# Patient Record
Sex: Male | Born: 1990 | Race: White | Hispanic: No | Marital: Married | Smoking: Never smoker
Health system: Southern US, Community
[De-identification: ages and names within clinical notes are randomized; demographics above are authoritative.]

## PROBLEM LIST (undated history)

## (undated) DIAGNOSIS — I10 Essential (primary) hypertension: Secondary | ICD-10-CM

## (undated) DIAGNOSIS — L309 Dermatitis, unspecified: Secondary | ICD-10-CM

## (undated) HISTORY — DX: Essential (primary) hypertension: I10

## (undated) HISTORY — DX: Dermatitis, unspecified: L30.9

## (undated) HISTORY — PX: OTHER SURGICAL HISTORY: SHX169

## (undated) HISTORY — PX: VASECTOMY: SHX75

---

## 2014-05-27 DIAGNOSIS — D2361 Other benign neoplasm of skin of right upper limb, including shoulder: Secondary | ICD-10-CM | POA: Insufficient documentation

## 2016-08-07 ENCOUNTER — Emergency Department (HOSPITAL_COMMUNITY): Payer: 59

## 2016-08-07 ENCOUNTER — Encounter (HOSPITAL_COMMUNITY): Payer: Self-pay | Admitting: Emergency Medicine

## 2016-08-07 ENCOUNTER — Emergency Department (HOSPITAL_COMMUNITY)
Admission: EM | Admit: 2016-08-07 | Discharge: 2016-08-07 | Disposition: A | Discharge status: Home / Self Care | Payer: 59 | Attending: Emergency Medicine | Admitting: Emergency Medicine

## 2016-08-07 DIAGNOSIS — R202 Paresthesia of skin: Secondary | ICD-10-CM | POA: Diagnosis not present

## 2016-08-07 DIAGNOSIS — R4701 Aphasia: Secondary | ICD-10-CM | POA: Insufficient documentation

## 2016-08-07 DIAGNOSIS — G43809 Other migraine, not intractable, without status migrainosus: Secondary | ICD-10-CM | POA: Insufficient documentation

## 2016-08-07 DIAGNOSIS — G43909 Migraine, unspecified, not intractable, without status migrainosus: Secondary | ICD-10-CM | POA: Diagnosis not present

## 2016-08-07 DIAGNOSIS — R51 Headache: Secondary | ICD-10-CM | POA: Diagnosis not present

## 2016-08-07 DIAGNOSIS — R519 Headache, unspecified: Secondary | ICD-10-CM

## 2016-08-07 DIAGNOSIS — R0602 Shortness of breath: Secondary | ICD-10-CM | POA: Diagnosis not present

## 2016-08-07 DIAGNOSIS — R29818 Other symptoms and signs involving the nervous system: Secondary | ICD-10-CM | POA: Diagnosis not present

## 2016-08-07 LAB — COMPREHENSIVE METABOLIC PANEL
ALT: 25 U/L (ref 17–63)
ANION GAP: 11 (ref 5–15)
AST: 22 U/L (ref 15–41)
Albumin: 4.8 g/dL (ref 3.5–5.0)
Alkaline Phosphatase: 68 U/L (ref 38–126)
BILIRUBIN TOTAL: 0.6 mg/dL (ref 0.3–1.2)
BUN: 18 mg/dL (ref 6–20)
CO2: 24 mmol/L (ref 22–32)
Calcium: 10.1 mg/dL (ref 8.9–10.3)
Chloride: 102 mmol/L (ref 101–111)
Creatinine, Ser: 0.85 mg/dL (ref 0.61–1.24)
GFR calc Af Amer: >60 mL/min (ref >60)
Glucose, Bld: 110 mg/dL — ABNORMAL HIGH (ref 65–99)
POTASSIUM: 3.5 mmol/L (ref 3.5–5.1)
Sodium: 137 mmol/L (ref 135–145)
TOTAL PROTEIN: 7.8 g/dL (ref 6.5–8.1)

## 2016-08-07 LAB — CBC
HEMATOCRIT: 40.5 % (ref 39.0–52.0)
Hemoglobin: 14.9 g/dL (ref 13.0–17.0)
MCH: 31.4 pg (ref 26.0–34.0)
MCHC: 36.8 g/dL — AB (ref 30.0–36.0)
MCV: 85.4 fL (ref 78.0–100.0)
Platelets: 223 10*3/uL (ref 150–400)
RBC: 4.74 MIL/uL (ref 4.22–5.81)
RDW: 12.9 % (ref 11.5–15.5)
WBC: 4.9 10*3/uL (ref 4.0–10.5)

## 2016-08-07 LAB — DIFFERENTIAL
Basophils Absolute: 0 10*3/uL (ref 0.0–0.1)
Basophils Relative: 0 %
EOS ABS: 0.1 10*3/uL (ref 0.0–0.7)
EOS PCT: 1 %
LYMPHS ABS: 1.6 10*3/uL (ref 0.7–4.0)
Lymphocytes Relative: 32 %
MONO ABS: 0.4 10*3/uL (ref 0.1–1.0)
MONOS PCT: 8 %
Neutro Abs: 2.9 10*3/uL (ref 1.7–7.7)
Neutrophils Relative %: 59 %

## 2016-08-07 LAB — I-STAT TROPONIN, ED: TROPONIN I, POC: 0 ng/mL (ref 0.00–0.08)

## 2016-08-07 LAB — RAPID URINE DRUG SCREEN, HOSP PERFORMED
Amphetamines: NOT DETECTED
BENZODIAZEPINES: NOT DETECTED
Barbiturates: NOT DETECTED
Cocaine: NOT DETECTED
Opiates: NOT DETECTED
Tetrahydrocannabinol: NOT DETECTED

## 2016-08-07 LAB — I-STAT CHEM 8, ED
BUN: 19 mg/dL (ref 6–20)
CALCIUM ION: 1.16 mmol/L (ref 1.15–1.40)
CREATININE: 0.8 mg/dL (ref 0.61–1.24)
Chloride: 100 mmol/L — ABNORMAL LOW (ref 101–111)
Glucose, Bld: 109 mg/dL — ABNORMAL HIGH (ref 65–99)
HEMATOCRIT: 42 % (ref 39.0–52.0)
HEMOGLOBIN: 14.3 g/dL (ref 13.0–17.0)
Potassium: 3.5 mmol/L (ref 3.5–5.1)
SODIUM: 138 mmol/L (ref 135–145)
TCO2: 25 mmol/L (ref 0–100)

## 2016-08-07 LAB — PROTIME-INR
INR: 1.05
Prothrombin Time: 13.8 seconds (ref 11.4–15.2)

## 2016-08-07 LAB — APTT: aPTT: 26 seconds (ref 24–36)

## 2016-08-07 MED ORDER — DIPHENHYDRAMINE HCL 50 MG/ML IJ SOLN
25.0000 mg | Freq: Once | INTRAMUSCULAR | Status: AC
  Administered 2016-08-07: 25 mg via INTRAVENOUS
  Filled 2016-08-07: qty 1

## 2016-08-07 MED ORDER — SODIUM CHLORIDE 0.9 % IV BOLUS (SEPSIS)
1000.0000 mL | Freq: Once | INTRAVENOUS | Status: AC
  Administered 2016-08-07: 1000 mL via INTRAVENOUS

## 2016-08-07 MED ORDER — PROCHLORPERAZINE EDISYLATE 5 MG/ML IJ SOLN
10.0000 mg | Freq: Once | INTRAMUSCULAR | Status: AC
  Administered 2016-08-07: 10 mg via INTRAVENOUS
  Filled 2016-08-07: qty 2

## 2016-08-07 NOTE — Consult Note (Signed)
Neurology Consultation  Reason for Consult: headache and stroke like symptoms  Referring Physician: ER - Dr. Rubin Payor / Dr. Dalene Seltzer  CC: headache/speech problems/numbness/weakness  History is obtained from: patient  HPI: Joshua Wise is a 26 y.o. male with no PMH who woke up this morning around 7 AM with what he describes as a "pounding" behind his right eye. He says he does not have a history of migraines in the recent past and had a few migraine headaches in freshman year of  college but they were infrequent. This morning, his headaches arisen as he woke up, he had photophobia with it and had some nausea vomiting as well. He was taken to [facility] where a noncontrast CT head was done which was unremarkable. He had also that on complaint of right facial numbness and according to the ER report also had some expressive aphasia all of which had resolved by the time he reached the outside hospital emergency room. He was transferred to our hospital for a neurological evaluation as well as a stat MRI. Upon arrival here, his vitals showed a blood pressure 148/92, normal heart rate and respiratory rate was saturating normally on RA. He had no focal neurological complaints but said that he is head was hurting very badly and he had some numbness around his mouth. He denied any preceding illnesses. He denied any chest pain. He denies any shortness of breath. He denied any nausea or vomiting currently. He denied using any illicit drugs. He drinks alcohol occasionally and had 6-8 beers yesterday. He has no family history of strokes or MI.   LKW: Woke up at 7 AM with symptoms. Last known normal last night tpa given?: no - less likely a stroke, NIHSS 0. More likely a complex migraine. Premorbid modified rankin scale: 0 ICH Score: Not applicable   ROS: A 14 point ROS was performed and is negative except as noted in the HPI.  History reviewed. No pertinent past medical history.  Family  history Both parents alive and healthy with no medical conditions. No family history of strokes or MIs.   Social History: Denies smoking or drug use Denies illicit drug use Occasional alcohol use with last use yesterday 6-8 beers.  Exam: Current vital signs: BP (!) 145/97   Pulse (!) 57   Temp 97.7 F (36.5 C) (Oral)   Resp 14   Ht 6' (1.829 m)   Wt 83.9 kg (185 lb)   SpO2 97%   BMI 25.09 kg/m  Vital signs in last 24 hours: Temp:  [97.7 F (36.5 C)] 97.7 F (36.5 C) (07/09 1016) Pulse Rate:  [57-72] 57 (07/09 1100) Resp:  [14-26] 14 (07/09 1018) BP: (139-147)/(89-97) 145/97 (07/09 1100) SpO2:  [97 %-100 %] 97 % (07/09 1100) Weight:  [83.9 kg (185 lb)] 83.9 kg (185 lb) (07/09 0920)   Physical Exam  Constitutional: Appears well-developed and well-nourished.  Psych: Affect appropriate to situation Eyes: No scleral injection HENT: [place] AT clear nares, clear throat Head: Normocephalic.  Cardiovascular: Normal rate and regular rhythm. No carotid bruit Respiratory: Effort normal and breath sounds normal to anterior ascultation GI: Soft.  No distension. There is no tenderness.  Skin: WDI  Neuro: Mental Status: Patient is awake, alert, oriented to person, place, month, year, and situation. Patient is able to give a clear and coherent history. No signs of aphasia or neglect Cranial Nerves: II: Visual Fields are full. Pupils are equal, round, and reactive to light. III,IV, VI: EOMI without ptosis or  diploplia.  V: Facial sensation is symmetric to temperature VII: Facial movement is symmetric.  VIII: hearing is intact to voice X: Uvula elevates symmetrically XI: Shoulder shrug is symmetric. XII: tongue is midline without atrophy or fasciculations.  Motor: Tone is normal. Bulk is normal. 5/5 strength was present in all four extremities. Sensory: Sensation is symmetric to light touch and temperature in the arms and legs. Deep Tendon Reflexes: 2+ and symmetric in the  biceps and patellae. Plantars: Toes are downgoing bilaterally. Cerebellar: FNF and HKS are intact bilaterally  NIHSS - 0  CBC    Component Value Date/Time   WBC 4.9 08/07/2016 0942   RBC 4.74 08/07/2016 0942   HGB 14.3 08/07/2016 0955   HCT 42.0 08/07/2016 0955   PLT 223 08/07/2016 0942   MCV 85.4 08/07/2016 0942   MCH 31.4 08/07/2016 0942   MCHC 36.8 (H) 08/07/2016 0942   RDW 12.9 08/07/2016 0942   LYMPHSABS 1.6 08/07/2016 0942   MONOABS 0.4 08/07/2016 0942   EOSABS 0.1 08/07/2016 0942   BASOSABS 0.0 08/07/2016 0942   CMP     Component Value Date/Time   NA 138 08/07/2016 0955   K 3.5 08/07/2016 0955   CL 100 (L) 08/07/2016 0955   CO2 24 08/07/2016 0942   GLUCOSE 109 (H) 08/07/2016 0955   BUN 19 08/07/2016 0955   CREATININE 0.80 08/07/2016 0955   CALCIUM 10.1 08/07/2016 0942   PROT 7.8 08/07/2016 0942   ALBUMIN 4.8 08/07/2016 0942   AST 22 08/07/2016 0942   ALT 25 08/07/2016 0942   ALKPHOS 68 08/07/2016 0942   BILITOT 0.6 08/07/2016 0942   GFRNONAA >60 08/07/2016 0942   GFRAA >60 08/07/2016 0942    I have reviewed labs in epic and the results pertinent to this consultation are: Noncontrast [facility] shows no acute abnormality. [facility] shows no evidence of stroke or concern for demyelinating lesions.  Assessment: 26 year old Caucasian man with no past medical history presented for evaluation of acute disabling headache with right-sided facial numbness/tingling as well as reported word finding difficulty and right-sided weakness which had completely resolved by the time of this evaluation. On examination in the emergency room, his NIH stroke scale was 0. His neurological exam was essentially unremarkable. We recommended a stat MRI of the brain to rule out an acute stroke, which was done and was negative for acute stroke.   Impression -Migraine v. Atypical headache -Less likely a stroke-given atypical history,  presentation, negative imaging findings.  Recommendations: -Symptomatic treatment of headache - reglan/benadryl/toradol iv and IV hydration -Check alcohol levels and UDS and counsel on alcohol cessation/cessation of binge drinking. -Can be discharged home if symptoms improve with the regimen above. -Can follow up with outpatient neurology if needed. Please call with questions.  -- Milon DikesAshish Marna Weniger, MD [facility] [phone]  If 7pm to 7am, please call on call as listed on AMION..Marland Kitchen

## 2016-08-07 NOTE — ED Notes (Signed)
ED Provider at bedside. 

## 2016-08-07 NOTE — ED Triage Notes (Addendum)
patient called male visitor about 7425 ins ago stating that had headache and right sided numbness.  Patient also c/o SOB. Patient has no medical history. patient having blurred vision, patient had that intermittently yesterday but thought was related to lighting changes. Patient also having trouble getting thoughts out.

## 2016-08-07 NOTE — ED Provider Notes (Signed)
[facility]. Please see Dr. Arlington CalixPickering's note for history, physical and prior care. Briefly this is a 26yo male who presented with headache, aphasia, right sided numbness/weakness and facial droop.  Arrived and was evaluated by Neurology. Recommend MR brain, if negative, treatment for migraine and discharge with outpatient follow up.  MR shows no sign of CVA or other abnormalities.  Given compazine/benadryl with improvement in headache.  Suspect complicated migraine. Patient discharged in stable condition with understanding of reasons to return.    Alvira MondaySchlossman, Elizabth Palka, MD 08/08/16 1242

## 2016-08-07 NOTE — Code Documentation (Signed)
26yo male arriving to [facility] via [facility] at [id number].  [facility].  Patient [facility] following acute onset headache, right sided numbness and blurred vision at 0700 while at work.  Patient noted to have episodes of expressive [facility] and [facility].  Stroke team at the bedside on patient arrival.  NIHSS 0, see documentation for details and code stroke times.  Patient with no focal deficits on exam at this time.  Patient continues to c/o headache which patient describes as a migraine.  Of note, patient reports he drank 6-7 beers last night.  Patient to go to MRI when next scanner available.  Handoff with ED RN Chestine Sporelark.

## 2016-08-07 NOTE — ED Provider Notes (Signed)
[facility]-EMERGENCY DEPT Provider Note   CSN: [id number] Arrival date & time: 08/07/16  [id number]     History   Chief Complaint Chief Complaint  Patient presents with  . right sided numbness  . Headache  . Shortness of Breath   Level V caveat due to aphasia HPI Joshua Wise is a 26 y.o. male.  HPI Patient presents with headache difficulty speaking and some right-sided numbness. Woke up this morning normal. Around 7:00 developed a left-sided headache. Also had slight difficulty speaking at that time. Also some right-sided numbness/weakness. States he was having difficulty driving due to some shaking in his foot. States that his vision got a little blurry. States is blurry in both eyes. At around 9:00 became worse. Then sent to the ER. Headache is throbbing on the left side. Has occasional headaches but not this severe. Headache began somewhat gradually. No previous episodes of numbness or weakness. Patient's wife states that he had had a facial droop also. Facial droop was on the right side. That has since resolved. While I was interviewing him that was an episode where the aphasia resolved completely. Resolved for around 2 minutes and then returned.    History reviewed. No pertinent past medical history.  There are no active problems to display for this patient.   History reviewed. No pertinent surgical history.     Home Medications    Prior to Admission medications   Not on File    Family History No family history on file.  Social History Social History  Substance Use Topics  . Smoking status: Never Smoker  . Smokeless tobacco: Never Used  . Alcohol use No     Comment: occasional      Allergies   Patient has no known allergies.   Review of Systems Review of Systems  Unable to perform ROS: Patient nonverbal  Constitutional: Negative for appetite change.  Eyes: Positive for visual disturbance.  Respiratory: Positive for shortness of breath.   Gastrointestinal:  Negative for abdominal pain.  Musculoskeletal: Negative for back pain.  Neurological: Positive for speech difficulty, numbness and headaches. Negative for weakness.     Physical Exam Updated Vital Signs BP (!) 139/93   Pulse 65   Temp 97.7 F (36.5 C) (Oral)   Resp 14   Ht 6' (1.829 m)   Wt 83.9 kg (185 lb)   SpO2 100%   BMI 25.09 kg/m   Physical Exam  Constitutional: He is oriented to person, place, and time. He appears well-developed.  HENT:  Head: Atraumatic.  Eyes: EOM are normal.  Neck: Neck supple.  Cardiovascular: Normal rate.   Pulmonary/Chest: Effort normal.  Abdominal: Soft.  Musculoskeletal: He exhibits no edema or tenderness.  Neurological: He is alert and oriented to person, place, and time.  I movements intact. Pupils reactive. Visual fields grossly intact by confrontation. Face symmetric. Good smile bilaterally. Good grips bilaterally. Good flexion-extension of bilateral upper extremity's. Sensation intact grossly over hands. Sensation intact both lower extremities. Good straight leg raise bilaterally. Good flexion extension at ankles. Sensation intact over right and left lower extremity. Reportedly had normal ambulation in here. He does however have a expressive aphasia. Testis surgery for some words and is somewhat cannot find. Able to complete some sentences but not others. Does better with shorter sentences.  Skin: Skin is warm. Capillary refill takes less than 2 seconds.     ED Treatments / Results  Labs (all labs ordered are listed, but only abnormal results are displayed) Labs Reviewed  CBC - Abnormal; Notable for the following:       Result Value   MCHC 36.8 (*)    All other components within normal limits  I-STAT CHEM 8, ED - Abnormal; Notable for the following:    Chloride 100 (*)    Glucose, Bld 109 (*)    All other components within normal limits  PROTIME-INR  APTT  DIFFERENTIAL  COMPREHENSIVE METABOLIC PANEL  I-STAT TROPOININ, ED  CBG  MONITORING, ED    EKG  EKG Interpretation  Date/Time:  Monday August 07 2016 09:22:10 EDT Ventricular Rate:  76 PR Interval:    QRS Duration: 96 QT Interval:  383 QTC Calculation: 431 R Axis:   82 Text Interpretation:  Sinus rhythm Probable left ventricular hypertrophy Anterior ST elevation, probably due to LVH No old tracing to compare Confirmed by Rubin Payor  MD, Doyl Bitting [phone]) on 08/07/2016 9:26:35 AM       Radiology Ct Head Code Stroke W/o Cm  Result Date: 08/07/2016 CLINICAL DATA:  Code stroke. Sudden onset headache. Intermittent tingling and facial droop. Intermittent blurred vision. Right-sided numbness and weakness. EXAM: CT HEAD WITHOUT CONTRAST TECHNIQUE: Contiguous axial images were obtained from the base of the skull through the vertex without intravenous contrast. COMPARISON:  None FINDINGS: Brain: No acute infarct, hemorrhage, or mass lesion is present. The ventricles are of normal size. No significant extraaxial fluid collection is present. No significant white matter disease is present. The basal ganglia are intact. Vascular: The intracranial vessels are uniformly hyperdense, likely reflecting a relatively high hematocrit. There is no significant asymmetry. Skull: Normal. Negative for fracture or focal lesion. Sinuses/Orbits: The paranasal sinuses and mastoid air cells are clear. The globes and orbits are within normal limits. ASPECTS [facility] Stroke Program Early CT Score) - Ganglionic level infarction (caudate, lentiform nuclei, internal capsule, insula, M1-M3 cortex): 7/7 - Supraganglionic infarction (M4-M6 cortex): 3/3 Total score (0-10 with 10 being normal): 10/10 IMPRESSION: 1. Negative CT of the head. No acute or focal lesion to explain the patient's acute symptoms. 2. ASPECTS is 10/10 These results were called by telephone at the time of interpretation on 08/07/2016 at 9:55 Am to Dr. Benjiman Core , who verbally acknowledged these results. Electronically Signed   By:  Marin Roberts M.D.   On: 08/07/2016 10:05    Procedures Procedures (including critical care time)  Medications Ordered in ED Medications - No data to display   Initial Impression / Assessment and Plan / ED Course  I have reviewed the triage vital signs and the nursing notes.  Pertinent labs & imaging results that were available during my care of the patient were reviewed by me and considered in my medical decision making (see chart for details).     Patient with headache and neurologic deficits. Did deficit been waxing and waning including a period of resolution on the ER. However deficits did return. Initial head CT reassuring. Discussed with Dr. Alfredo Batty from neuroradiology. It appears I cannot get an MRI done here within the next 20 minutes. Code stroke was called. Potentially a complicated migraine but does have deficits. He did have some resolution of symptoms while in the ER. Will transfer to [facility] as a code stroke. Discussed with Dr Dalene Seltzer and Dr Wilford Corner.  CRITICAL CARE Performed by: Billee Cashing Total critical care time: 30 minutes Critical care time was exclusive of separately billable procedures and treating other patients. Critical care was necessary to treat or prevent imminent or life-threatening deterioration. Critical care was time spent  personally by me on the following activities: development of treatment plan with patient and/or surrogate as well as nursing, discussions with consultants, evaluation of patient's response to treatment, examination of patient, obtaining history from patient or surrogate, ordering and performing treatments and interventions, ordering and review of laboratory studies, ordering and review of radiographic studies, pulse oximetry and re-evaluation of patient's condition.    Final Clinical Impressions(s) / ED Diagnoses   Final diagnoses:  Expressive aphasia  Nonintractable headache, unspecified chronicity pattern,  unspecified headache type    New Prescriptions New Prescriptions   No medications on file     Benjiman Core, MD 08/07/16 1025

## 2016-08-07 NOTE — ED Notes (Signed)
Patient transported to MRI 

## 2016-08-07 NOTE — ED Notes (Signed)
Pt back from MRI 

## 2016-08-08 ENCOUNTER — Encounter: Payer: Self-pay | Admitting: Neurology

## 2016-11-13 ENCOUNTER — Ambulatory Visit: Payer: 59 | Admitting: Neurology

## 2016-11-30 ENCOUNTER — Encounter: Payer: Self-pay | Admitting: Family Medicine

## 2016-11-30 ENCOUNTER — Ambulatory Visit (INDEPENDENT_AMBULATORY_CARE_PROVIDER_SITE_OTHER): Payer: 59 | Admitting: Family Medicine

## 2016-11-30 VITALS — BP 132/80 | HR 82 | Temp 98.7°F | Resp 18 | Ht 73.0 in | Wt 190.6 lb

## 2016-11-30 DIAGNOSIS — R519 Headache, unspecified: Secondary | ICD-10-CM

## 2016-11-30 DIAGNOSIS — F419 Anxiety disorder, unspecified: Secondary | ICD-10-CM | POA: Diagnosis not present

## 2016-11-30 DIAGNOSIS — I10 Essential (primary) hypertension: Secondary | ICD-10-CM

## 2016-11-30 DIAGNOSIS — Z7689 Persons encountering health services in other specified circumstances: Secondary | ICD-10-CM

## 2016-11-30 DIAGNOSIS — R51 Headache: Secondary | ICD-10-CM | POA: Diagnosis not present

## 2016-11-30 DIAGNOSIS — R9431 Abnormal electrocardiogram [ECG] [EKG]: Secondary | ICD-10-CM

## 2016-11-30 LAB — POCT URINALYSIS DIP (MANUAL ENTRY)
Bilirubin, UA: NEGATIVE
Blood, UA: NEGATIVE
Glucose, UA: NEGATIVE mg/dL
Ketones, POC UA: NEGATIVE mg/dL
Leukocytes, UA: NEGATIVE
Nitrite, UA: NEGATIVE
Protein Ur, POC: NEGATIVE mg/dL
Spec Grav, UA: 1.02 (ref 1.010–1.025)
Urobilinogen, UA: 0.2 E.U./dL
pH, UA: 6 (ref 5.0–8.0)

## 2016-11-30 NOTE — Progress Notes (Signed)
11/1/20183:08 PM  Joshua Wise [birth year], 26 y.o. male [id number]  Chief Complaint  Patient presents with  . Establish Care    HPI:   Patient is a 26 y.o. male with no past medical history who presents today to establish care.  Patient reports that he has about 6 months of:  1: headaches, throbbing, mostly left sided but occ diffuse. Not really a/w vision, hearing changes, dizziness or nausea. He states that he will hazy vision or maybe see a bright spot light if the headache is very strong. He states that he gets them about 2-3 times a week, normally in the evening. He will  Take either ASA or ibuprofen for them, sometimes he just goes to bed. Headaches never wake him up from sleep and not a/e exertion. He reports seen in ER with complex migraine this summer, normal MRI, presented with facial numbness, drooping and aphasia. He states that previous to this complex migraine this summer, he had not had issues with headaches in years. Used to get tension type headaches when he was a teenager. Has upcoming appt with neuro in jan 2019  2. Sudden issues with anxiety, worrying, irritability, mood swings, decreased libido, he states that this is very out of character for him as he tends to be very easy going and not worrying type  3. Liable blood pressure. Patient states that his BP was elevated when seen in the ER and his wife who is a nurse has checked his BP sporadically, usually when he has a headache, and it has been elevated at times, as high as SBP 170s. No fhx HTN. Exercises about 5 times a week. Has lost about 40 lbs in 2 years, intentionally. Has never smoked tobacco, but used to chew, quit about a year ago. Reports had abnormal EKG during ER visit.   Depression screen PHQ 2/9 11/30/2016  Decreased Interest 0  Down, Depressed, Hopeless 0  PHQ - 2 Score 0    No Known Allergies  Prior to Admission medications   Not on File    History reviewed. No pertinent past medical  history.  History reviewed. No pertinent surgical history.  Social History  Substance Use Topics  . Smoking status: Never Smoker  . Smokeless tobacco: Never Used  . Alcohol use No     Comment: occasional     Family History  Problem Relation Age of Onset  . Cancer Maternal Grandmother   . Cancer Maternal Grandfather     Review of Systems  Constitutional: Negative for chills, diaphoresis, fever and malaise/fatigue.  HENT: Negative for congestion, ear pain, hearing loss, sore throat and tinnitus.   Eyes: Positive for blurred vision. Negative for photophobia.  Respiratory: Negative for cough and shortness of breath.   Cardiovascular: Negative for chest pain, palpitations and leg swelling.  Gastrointestinal: Negative for abdominal pain, constipation, diarrhea, nausea and vomiting.  Genitourinary: Negative for dysuria and hematuria.  Neurological: Positive for headaches. Negative for dizziness.  Psychiatric/Behavioral: Negative for substance abuse. The patient is nervous/anxious.      OBJECTIVE:  Blood pressure 132/80, pulse 82, temperature 98.7 F (37.1 C), temperature source Oral, resp. rate 18, height 6\' 1"  (1.854 m), weight 190 lb 9.6 oz (86.5 kg), SpO2 98 %.  Physical Exam  Constitutional: He is oriented to person, place, and time and well-developed, well-nourished, and in no distress.  HENT:  Head: Normocephalic and atraumatic.  Right Ear: Hearing, tympanic membrane, external ear and ear canal normal.  Left Ear: Hearing, tympanic membrane,  external ear and ear canal normal.  Mouth/Throat: Oropharynx is clear and moist. No oropharyngeal exudate.  Eyes: Pupils are equal, round, and reactive to light. Conjunctivae and EOM are normal.  Neck: Neck supple. No thyromegaly present.  Cardiovascular: Normal rate, regular rhythm, normal heart sounds and intact distal pulses.  Exam reveals no gallop and no friction rub.   No murmur heard. Pulmonary/Chest: Effort normal and breath  sounds normal. He has no wheezes. He has no rales.  Abdominal: Soft. Bowel sounds are normal. He exhibits no distension and no mass. There is no tenderness.  Musculoskeletal: Normal range of motion. He exhibits no edema.  Lymphadenopathy:    He has no cervical adenopathy.  Neurological: He is alert and oriented to person, place, and time. He has normal reflexes. No cranial nerve deficit. Gait normal.  Skin: Skin is warm and dry.  Psychiatric: Mood and affect normal.       Results for orders placed or performed in visit on 11/30/16 (from the past 24 hour(s))  POCT urinalysis dipstick     Status: None   Collection Time: 11/30/16  2:44 PM  Result Value Ref Range   Color, UA yellow yellow   Clarity, UA clear clear   Glucose, UA negative negative mg/dL   Bilirubin, UA negative negative   Ketones, POC UA negative negative mg/dL   Spec Grav, UA [id number] - 1.025   Blood, UA negative negative   pH, UA 6.0 5.0 - 8.0   Protein Ur, POC negative negative mg/dL   Urobilinogen, UA 0.2 0.2 or 1.0 E.U./dL   Nitrite, UA Negative Negative   Leukocytes, UA Negative Negative    ASSESSMENT and PLAN  1. Encounter to establish care PMH, PSH, meds, allergies, Fhx, Shx, reviewed with patient today.  2. Hypertension, unspecified type Given presentation of symptoms all around the same time wondering about pheo.  Referring to cards for further eval of LVH. RTC precautions discussed. - EKG 12-Lead - CBC with Differential - Comprehensive metabolic panel - Lipid panel - TSH - POCT urinalysis dipstick - Ambulatory referral to Cardiology - Catecholamines, fractionated, urine, 24 hour - Metanephrines, urine, 24 hour  3. Frequent headaches See above. Advised to keep upcoming appt with neuro. Cont with prn NSAID use.  - CBC with Differential - Comprehensive metabolic panel - Lipid panel - TSH - Catecholamines, fractionated, urine, 24 hour - Metanephrines, urine, 24 hour  4. Anxiety See above.    - TSH - Catecholamines, fractionated, urine, 24 hour - Metanephrines, urine, 24 hour  5. Nonspecific abnormal electrocardiogram (ECG) (EKG) - EKG 12-Lead - LVH, unchanged when compared to July 2018 - CBC with Differential - Comprehensive metabolic panel - Lipid panel - TSH - Ambulatory referral to Cardiology  Return in about 4 weeks (around 12/28/2016).    Myles LippsIrma M Santiago, MD Primary Care at [facility] [address] [place], [place] [address] Ph.  [phone] Fax [phone]

## 2016-11-30 NOTE — Patient Instructions (Signed)
1. Collect 24 hour urine for evaluation of pheocytochroma 2. Referring to cardiology 3. Keep upcoming appt with neurology 4. FU in 4 weeks DASH Eating Plan DASH stands for "Dietary Approaches to Stop Hypertension." The DASH eating plan is a healthy eating plan that has been shown to reduce high blood pressure (hypertension). It may also reduce your risk for type 2 diabetes, heart disease, and stroke. The DASH eating plan may also help with weight loss. What are tips for following this plan? General guidelines  Avoid eating more than 2,300 mg (milligrams) of salt (sodium) a day. If you have hypertension, you may need to reduce your sodium intake to 1,500 mg a day.  Limit alcohol intake to no more than 1 drink a day for nonpregnant women and 2 drinks a day for men. One drink equals 12 oz of beer, 5 oz of wine, or 1 oz of hard liquor.  Work with your health care provider to maintain a healthy body weight or to lose weight. Ask what an ideal weight is for you.  Get at least 30 minutes of exercise that causes your heart to beat faster (aerobic exercise) most days of the week. Activities may include walking, swimming, or biking.  Work with your health care provider or diet and nutrition specialist (dietitian) to adjust your eating plan to your individual calorie needs. Reading food labels  Check food labels for the amount of sodium per serving. Choose foods with less than 5 percent of the Daily Value of sodium. Generally, foods with less than 300 mg of sodium per serving fit into this eating plan.  To find whole grains, look for the word "whole" as the first word in the ingredient list. Shopping  Buy products labeled as "low-sodium" or "no salt added."  Buy fresh foods. Avoid canned foods and premade or frozen meals. Cooking  Avoid adding salt when cooking. Use salt-free seasonings or herbs instead of table salt or sea salt. Check with your health care provider or pharmacist before using  salt substitutes.  Do not fry foods. Cook foods using healthy methods such as baking, boiling, grilling, and broiling instead.  Cook with heart-healthy oils, such as olive, canola, soybean, or sunflower oil. Meal planning   Eat a balanced diet that includes: ? 5 or more servings of fruits and vegetables each day. At each meal, try to fill half of your plate with fruits and vegetables. ? Up to 6-8 servings of whole grains each day. ? Less than 6 oz of lean meat, poultry, or fish each day. A 3-oz serving of meat is about the same size as a deck of cards. One egg equals 1 oz. ? 2 servings of low-fat dairy each day. ? A serving of nuts, seeds, or beans 5 times each week. ? Heart-healthy fats. Healthy fats called Omega-3 fatty acids are found in foods such as flaxseeds and coldwater fish, like sardines, salmon, and mackerel.  Limit how much you eat of the following: ? Canned or prepackaged foods. ? Food that is high in trans fat, such as fried foods. ? Food that is high in saturated fat, such as fatty meat. ? Sweets, desserts, sugary drinks, and other foods with added sugar. ? Full-fat dairy products.  Do not salt foods before eating.  Try to eat at least 2 vegetarian meals each week.  Eat more home-cooked food and less restaurant, buffet, and fast food.  When eating at a restaurant, ask that your food be prepared with less salt  or no salt, if possible. What foods are recommended? The items listed may not be a complete list. Talk with your dietitian about what dietary choices are best for you. Grains Whole-grain or whole-wheat bread. Whole-grain or whole-wheat pasta. Brown rice. [place]. Bulgur. Whole-grain and low-sodium cereals. Pita bread. Low-fat, low-sodium crackers. Whole-wheat flour tortillas. Vegetables Fresh or frozen vegetables (raw, steamed, roasted, or grilled). Low-sodium or reduced-sodium tomato and vegetable juice. Low-sodium or reduced-sodium tomato sauce and  tomato paste. Low-sodium or reduced-sodium canned vegetables. Fruits All fresh, dried, or frozen fruit. Canned fruit in natural juice (without added sugar). Meat and other protein foods Skinless chicken or [place]. Ground chicken or [place]. Pork with fat trimmed off. Fish and seafood. Egg whites. Dried beans, peas, or lentils. Unsalted nuts, nut butters, and seeds. Unsalted canned beans. Lean cuts of beef with fat trimmed off. Low-sodium, lean deli meat. Dairy Low-fat (1%) or fat-free (skim) milk. Fat-free, low-fat, or reduced-fat cheeses. Nonfat, low-sodium ricotta or cottage cheese. Low-fat or nonfat yogurt. Low-fat, low-sodium cheese. Fats and oils Soft margarine without trans fats. Vegetable oil. Low-fat, reduced-fat, or light mayonnaise and salad dressings (reduced-sodium). Canola, safflower, olive, soybean, and sunflower oils. Avocado. Seasoning and other foods Herbs. Spices. Seasoning mixes without salt. Unsalted popcorn and pretzels. Fat-free sweets. What foods are not recommended? The items listed may not be a complete list. Talk with your dietitian about what dietary choices are best for you. Grains Baked goods made with fat, such as croissants, muffins, or some breads. Dry pasta or rice meal packs. Vegetables Creamed or fried vegetables. Vegetables in a cheese sauce. Regular canned vegetables (not low-sodium or reduced-sodium). Regular canned tomato sauce and paste (not low-sodium or reduced-sodium). Regular tomato and vegetable juice (not low-sodium or reduced-sodium). Angie Fava. Olives. Fruits Canned fruit in a light or heavy syrup. Fried fruit. Fruit in cream or butter sauce. Meat and other protein foods Fatty cuts of meat. Ribs. Fried meat. Berniece Salines. Sausage. Bologna and other processed lunch meats. Salami. Fatback. Hotdogs. Bratwurst. Salted nuts and seeds. Canned beans with added salt. Canned or smoked fish. Whole eggs or egg yolks. Chicken or [place] with skin. Dairy Whole or 2%  milk, cream, and half-and-half. Whole or full-fat cream cheese. Whole-fat or sweetened yogurt. Full-fat cheese. Nondairy creamers. Whipped toppings. Processed cheese and cheese spreads. Fats and oils Butter. Stick margarine. Lard. Shortening. Ghee. Bacon fat. Tropical oils, such as coconut, palm kernel, or palm oil. Seasoning and other foods Salted popcorn and pretzels. Onion salt, garlic salt, seasoned salt, table salt, and sea salt. Worcestershire sauce. Tartar sauce. Barbecue sauce. Teriyaki sauce. Soy sauce, including reduced-sodium. Steak sauce. Canned and packaged gravies. Fish sauce. Oyster sauce. Cocktail sauce. Horseradish that you find on the shelf. Ketchup. Mustard. Meat flavorings and tenderizers. Bouillon cubes. Hot sauce and Tabasco sauce. Premade or packaged marinades. Premade or packaged taco seasonings. Relishes. Regular salad dressings. Where to find more information:  National Heart, Lung, and [place]: https://wilson-eaton.com/  American Heart Association: www.heart.org Summary  The DASH eating plan is a healthy eating plan that has been shown to reduce high blood pressure (hypertension). It may also reduce your risk for type 2 diabetes, heart disease, and stroke.  With the DASH eating plan, you should limit salt (sodium) intake to 2,300 mg a day. If you have hypertension, you may need to reduce your sodium intake to 1,500 mg a day.  When on the DASH eating plan, aim to eat more fresh fruits and vegetables, whole grains, lean proteins, low-fat dairy,  and heart-healthy fats.  Work with your health care provider or diet and nutrition specialist (dietitian) to adjust your eating plan to your individual calorie needs. This information is not intended to replace advice given to you by your health care provider. Make sure you discuss any questions you have with your health care provider. Document Released: 01/05/2011 Document Revised: 01/10/2016 Document Reviewed:  01/10/2016 Elsevier Interactive Patient Education  2017 [employer]

## 2016-12-01 LAB — LIPID PANEL
Chol/HDL Ratio: 4.1 ratio (ref 0.0–5.0)
Cholesterol, Total: 174 mg/dL (ref 100–199)
HDL: 42 mg/dL (ref >39)
LDL Calculated: 102 mg/dL — ABNORMAL HIGH (ref 0–99)
Triglycerides: 148 mg/dL (ref 0–149)
VLDL Cholesterol Cal: 30 mg/dL (ref 5–40)

## 2016-12-01 LAB — CBC WITH DIFFERENTIAL/PLATELET
Basophils Absolute: 0 10*3/uL (ref 0.0–0.2)
Basos: 0 %
EOS (ABSOLUTE): 0.1 10*3/uL (ref 0.0–0.4)
Eos: 1 %
Hematocrit: 40.7 % (ref 37.5–51.0)
Hemoglobin: 14.8 g/dL (ref 13.0–17.7)
Immature Grans (Abs): 0 10*3/uL (ref 0.0–0.1)
Immature Granulocytes: 0 %
Lymphocytes Absolute: 1.4 10*3/uL (ref 0.7–3.1)
Lymphs: 30 %
MCH: 31.8 pg (ref 26.6–33.0)
MCHC: 36.4 g/dL — ABNORMAL HIGH (ref 31.5–35.7)
MCV: 87 fL (ref 79–97)
Monocytes Absolute: 0.3 10*3/uL (ref 0.1–0.9)
Monocytes: 8 %
Neutrophils Absolute: 2.8 10*3/uL (ref 1.4–7.0)
Neutrophils: 61 %
Platelets: 220 10*3/uL (ref 150–379)
RBC: 4.66 x10E6/uL (ref 4.14–5.80)
RDW: 14.2 % (ref 12.3–15.4)
WBC: 4.6 10*3/uL (ref 3.4–10.8)

## 2016-12-01 LAB — COMPREHENSIVE METABOLIC PANEL
ALT: 24 IU/L (ref 0–44)
AST: 18 IU/L (ref 0–40)
Albumin/Globulin Ratio: 2.6 — ABNORMAL HIGH (ref 1.2–2.2)
Albumin: 5.1 g/dL (ref 3.5–5.5)
Alkaline Phosphatase: 72 IU/L (ref 39–117)
BUN/Creatinine Ratio: 16 (ref 9–20)
BUN: 16 mg/dL (ref 6–20)
Bilirubin Total: 0.4 mg/dL (ref 0.0–1.2)
CO2: 27 mmol/L (ref 20–29)
Calcium: 9.8 mg/dL (ref 8.7–10.2)
Chloride: 100 mmol/L (ref 96–106)
Creatinine, Ser: 0.99 mg/dL (ref 0.76–1.27)
GFR calc Af Amer: 121 mL/min/{1.73_m2} (ref >59)
GFR calc non Af Amer: 105 mL/min/{1.73_m2} (ref >59)
Globulin, Total: 2 g/dL (ref 1.5–4.5)
Glucose: 87 mg/dL (ref 65–99)
Potassium: 4.6 mmol/L (ref 3.5–5.2)
Sodium: 143 mmol/L (ref 134–144)
Total Protein: 7.1 g/dL (ref 6.0–8.5)

## 2016-12-01 LAB — TSH: TSH: 1.54 u[IU]/mL (ref 0.450–4.500)

## 2016-12-04 ENCOUNTER — Telehealth: Payer: Self-pay | Admitting: *Deleted

## 2016-12-20 DIAGNOSIS — I10 Essential (primary) hypertension: Secondary | ICD-10-CM | POA: Diagnosis not present

## 2016-12-20 DIAGNOSIS — Z0189 Encounter for other specified special examinations: Secondary | ICD-10-CM | POA: Diagnosis not present

## 2016-12-20 NOTE — Addendum Note (Signed)
Addended by: Baldwin CrownJOHNSON, SHAQUETTA D on: 12/20/2016 04:10 PM   Modules accepted: Orders

## 2016-12-25 DIAGNOSIS — I10 Essential (primary) hypertension: Secondary | ICD-10-CM | POA: Diagnosis not present

## 2016-12-25 DIAGNOSIS — R51 Headache: Secondary | ICD-10-CM | POA: Diagnosis not present

## 2016-12-25 NOTE — Addendum Note (Signed)
Addended by: MCNEILL, Mclain Freer A on: 12/25/2016 10:13 AM   Modules accepted: Orders

## 2016-12-28 ENCOUNTER — Other Ambulatory Visit: Payer: Self-pay

## 2016-12-28 ENCOUNTER — Encounter: Payer: Self-pay | Admitting: Family Medicine

## 2016-12-28 ENCOUNTER — Ambulatory Visit: Payer: 59 | Admitting: Family Medicine

## 2016-12-28 VITALS — BP 124/82 | HR 98 | Temp 98.9°F | Resp 18 | Ht 73.0 in | Wt 191.0 lb

## 2016-12-28 DIAGNOSIS — I10 Essential (primary) hypertension: Secondary | ICD-10-CM

## 2016-12-28 DIAGNOSIS — R9431 Abnormal electrocardiogram [ECG] [EKG]: Secondary | ICD-10-CM

## 2016-12-28 NOTE — Patient Instructions (Signed)
     IF you received an x-ray today, you will receive an invoice from [facility]. Please contact [facility] at [phone] with questions or concerns regarding your invoice.   IF you received labwork today, you will receive an invoice from [facility]. Please contact [facility] at [phone] with questions or concerns regarding your invoice.   Our billing staff will not be able to assist you with questions regarding bills from these companies.  You will be contacted with the lab results as soon as they are available. The fastest way to get your results is to activate your My Chart account. Instructions are located on the last page of this paperwork. If you have not heard from us regarding the results in 2 weeks, please contact this office.     

## 2016-12-28 NOTE — Progress Notes (Signed)
11/29/20181:59 PM  Joshua Wise 02/07/1990, 26 y.o. male [id number]  Chief Complaint  Patient presents with  . Labs Only    follow up     HPI:   Patient is a 26 y.o. male with past medical history significant for liable HTN, anxiety and flushing who presents today for fu on labs.  Since our last visit he was seen by cards and started on chlorthalidone. He has been tolerating it well and BP has stabilized. Patient states that cards believe ekg is a normal variant for him, but has an upcoming echo scheduled.   He has no acute concerns today.  Depression screen PHQ 2/9 11/30/2016  Decreased Interest 0  Down, Depressed, Hopeless 0  PHQ - 2 Score 0    No Known Allergies  Prior to Admission medications   Medication Sig Start Date End Date Taking? Authorizing Provider  chlorthalidone (HYGROTON) 25 MG tablet Take 12.5 mg by mouth daily.   Yes [provider]    Past Medical History:  Diagnosis Date  . Hypertension     History reviewed. No pertinent surgical history.  Social History   Tobacco Use  . Smoking status: Never Smoker  . Smokeless tobacco: Never Used  Substance Use Topics  . Alcohol use: No    Comment: occasional     Family History  Problem Relation Age of Onset  . Hypertension Father   . Cancer Paternal Grandmother        melanoma and breast cancer  . Cancer Paternal Grandfather        bladder and colon cancer    ROS Per hpi  OBJECTIVE:  Blood pressure 124/82, pulse 98, temperature 98.9 F (37.2 C), temperature source Oral, resp. rate 18, height 6\' 1"  (1.854 m), weight 191 lb (86.6 kg), SpO2 98 %.  Physical Exam  Constitutional: He is oriented to person, place, and time and well-developed, well-nourished, and in no distress.  HENT:  Head: Normocephalic and atraumatic.  Mouth/Throat: Oropharynx is clear and moist.  Eyes: EOM are normal. Pupils are equal, round, and reactive to light.  Neck: Neck supple.  Pulmonary/Chest: Effort  normal.  Neurological: He is alert and oriented to person, place, and time. Gait normal.  Skin: Skin is warm and dry.  Psychiatric: Mood and affect normal.  Nursing note and vitals reviewed.     Recent Results (from the past 2160 hour(s))  CBC with Differential     Status: Abnormal   Collection Time: 11/30/16  2:23 PM  Result Value Ref Range   WBC 4.6 3.4 - 10.8 x10E3/uL   RBC 4.66 4.14 - 5.80 x10E6/uL   Hemoglobin 14.8 13.0 - 17.7 g/dL   Hematocrit 40.940.7 81.137.5 - 51.0 %   MCV 87 79 - 97 fL   MCH 31.8 26.6 - 33.0 pg   MCHC 36.4 (H) 31.5 - 35.7 g/dL   RDW 91.414.2 78.212.3 - 95.615.4 %   Platelets 220 150 - 379 x10E3/uL   Neutrophils 61 Not Estab. %   Lymphs 30 Not Estab. %   Monocytes 8 Not Estab. %   Eos 1 Not Estab. %   Basos 0 Not Estab. %   Neutrophils Absolute 2.8 1.4 - 7.0 x10E3/uL   Lymphocytes Absolute 1.4 0.7 - 3.1 x10E3/uL   Monocytes Absolute 0.3 0.1 - 0.9 x10E3/uL   EOS (ABSOLUTE) 0.1 0.0 - 0.4 x10E3/uL   Basophils Absolute 0.0 0.0 - 0.2 x10E3/uL   Immature Granulocytes 0 Not Estab. %   Immature Grans (Abs)  0.0 0.0 - 0.1 x10E3/uL  Comprehensive metabolic panel     Status: Abnormal   Collection Time: 11/30/16  2:23 PM  Result Value Ref Range   Glucose 87 65 - 99 mg/dL   BUN 16 6 - 20 mg/dL   Creatinine, Ser 1.61 0.76 - 1.27 mg/dL   GFR calc non Af Amer 105 >59 mL/min/1.73   GFR calc Af Amer 121 >59 mL/min/1.73   BUN/Creatinine Ratio 16 9 - 20   Sodium 143 134 - 144 mmol/L   Potassium 4.6 3.5 - 5.2 mmol/L   Chloride 100 96 - 106 mmol/L   CO2 27 20 - 29 mmol/L   Calcium 9.8 8.7 - 10.2 mg/dL   Total Protein 7.1 6.0 - 8.5 g/dL   Albumin 5.1 3.5 - 5.5 g/dL   Globulin, Total 2.0 1.5 - 4.5 g/dL   Albumin/Globulin Ratio 2.6 (H) 1.2 - 2.2   Bilirubin Total 0.4 0.0 - 1.2 mg/dL   Alkaline Phosphatase 72 39 - 117 IU/L   AST 18 0 - 40 IU/L   ALT 24 0 - 44 IU/L  Lipid panel     Status: Abnormal   Collection Time: 11/30/16  2:23 PM  Result Value Ref Range   Cholesterol, Total  174 100 - 199 mg/dL   Triglycerides 096 0 - 149 mg/dL   HDL 42 >04 mg/dL   VLDL Cholesterol Cal 30 5 - 40 mg/dL   LDL Calculated 540 (H) 0 - 99 mg/dL   Chol/HDL Ratio 4.1 0.0 - 5.0 ratio    Comment:                                   T. Chol/HDL Ratio                                             Men  Women                               1/2 Avg.Risk  3.4    3.3                                   Avg.Risk  5.0    4.4                                2X Avg.Risk  9.6    7.1                                3X Avg.Risk 23.4   11.0   TSH     Status: None   Collection Time: 11/30/16  2:23 PM  Result Value Ref Range   TSH 1.540 0.450 - 4.500 uIU/mL  POCT urinalysis dipstick     Status: None   Collection Time: 11/30/16  2:44 PM  Result Value Ref Range   Color, UA yellow yellow   Clarity, UA clear clear   Glucose, UA negative negative mg/dL   Bilirubin, UA negative negative   Ketones, POC UA negative negative mg/dL   Spec Grav, UA 9.811 9.147 -  1.025   Blood, UA negative negative   pH, UA 6.0 5.0 - 8.0   Protein Ur, POC negative negative mg/dL   Urobilinogen, UA 0.2 0.2 or 1.0 E.U./dL   Nitrite, UA Negative Negative   Leukocytes, UA Negative Negative  Metanephrines, urine, 24 hour     Status: None   Collection Time: 12/25/16 10:18 AM  Result Value Ref Range   Normetanephrine, Ur 144 Undefined ug/L   Normetanephrine, 24H Ur 461 82 - 500 ug/24 hr    Comment:      (Hypertensive) >17 years 11 months:    110 - 1050   Metaneph Total, Ur 62 Undefined ug/L   Metanephrines, 24H Ur 198 45 - 290 ug/24 hr    Comment:      (Hypertensive) >17 years 11 months:     35 -  460  Catecholamines, fractionated, urine, 24 hour     Status: None (Preliminary result)   Collection Time: 12/25/16 10:18 AM  Result Value Ref Range   Epinephrine, Rand Ur WILL FOLLOW    Epinephrine, 24H Ur WILL FOLLOW    Norepinephrine, Rand Ur WILL FOLLOW    Norepinephrine, 24H Ur WILL FOLLOW    Dopamine, Rand Ur WILL FOLLOW     Dopamine , 24H Ur WILL FOLLOW      ASSESSMENT and PLAN  1. Essential hypertension Patient with normal workup for secondary causes, BP controlled with minimal education. Discussed LFM. - chlorthalidone (HYGROTON) 25 MG tablet; Take 12.5 mg by mouth daily.  2. Nonspecific abnormal electrocardiogram (ECG) (EKG) Has upcoming appt for echo, per cards   Return in about 6 months (around 06/27/2017).    Myles LippsIrma M Santiago, MD [facility] at [facility] [address] [place], [place] [address] Ph.  [phone] Fax [phone]

## 2016-12-29 LAB — CATECHOLAMINES, FRACTIONATED, URINE, 24 HOUR
Dopamine , 24H Ur: 259 ug/24 hr (ref 0–510)
Dopamine, Rand Ur: 81 ug/L
Epinephrine, 24H Ur: 13 ug/24 hr (ref 0–20)
Epinephrine, Rand Ur: 4 ug/L
Norepinephrine, 24H Ur: 67 ug/24 hr (ref 0–135)
Norepinephrine, Rand Ur: 21 ug/L

## 2016-12-29 LAB — METANEPHRINES, URINE, 24 HOUR
Metaneph Total, Ur: 62 ug/L
Metanephrines, 24H Ur: 198 ug/24 hr (ref 45–290)
Normetanephrine, 24H Ur: 461 ug/24 hr (ref 82–500)
Normetanephrine, Ur: 144 ug/L

## 2017-01-01 DIAGNOSIS — I10 Essential (primary) hypertension: Secondary | ICD-10-CM | POA: Diagnosis not present

## 2017-01-17 DIAGNOSIS — I10 Essential (primary) hypertension: Secondary | ICD-10-CM | POA: Diagnosis not present

## 2017-01-17 DIAGNOSIS — Z0189 Encounter for other specified special examinations: Secondary | ICD-10-CM | POA: Diagnosis not present

## 2017-02-02 ENCOUNTER — Ambulatory Visit: Payer: 59 | Admitting: Family Medicine

## 2017-02-02 ENCOUNTER — Encounter: Payer: Self-pay | Admitting: Family Medicine

## 2017-02-02 ENCOUNTER — Other Ambulatory Visit: Payer: Self-pay

## 2017-02-02 VITALS — BP 130/76 | HR 88 | Temp 98.6°F | Resp 17 | Ht 73.0 in | Wt 194.2 lb

## 2017-02-02 DIAGNOSIS — J3089 Other allergic rhinitis: Secondary | ICD-10-CM

## 2017-02-02 MED ORDER — MONTELUKAST SODIUM 10 MG PO TABS
10.0000 mg | ORAL_TABLET | Freq: Every day | ORAL | 3 refills | Status: DC
Start: 2017-02-02 — End: 2017-04-05

## 2017-02-02 NOTE — Patient Instructions (Addendum)
   IF you received an x-ray today, you will receive an invoice from [facility]. Please contact [facility] at [phone] with questions or concerns regarding your invoice.   IF you received labwork today, you will receive an invoice from [facility]. Please contact [facility] at [phone] with questions or concerns regarding your invoice.   Our billing staff will not be able to assist you with questions regarding bills from these companies.  You will be contacted with the lab results as soon as they are available. The fastest way to get your results is to activate your My Chart account. Instructions are located on the last page of this paperwork. If you have not heard from us regarding the results in 2 weeks, please contact this office.    Allergic Rhinitis, Adult Allergic rhinitis is an allergic reaction that affects the mucous membrane inside the nose. It causes sneezing, a runny or stuffy nose, and the feeling of mucus going down the back of the throat (postnasal drip). Allergic rhinitis can be mild to severe. There are two types of allergic rhinitis:  Seasonal. This type is also called hay fever. It happens only during certain seasons.  Perennial. This type can happen at any time of the year.  What are the causes? This condition happens when the body's defense system (immune system) responds to certain harmless substances called allergens as though they were germs.  Seasonal allergic rhinitis is triggered by pollen, which can come from grasses, trees, and weeds. Perennial allergic rhinitis may be caused by:  House dust mites.  Pet dander.  Mold spores.  What are the signs or symptoms? Symptoms of this condition include:  Sneezing.  Runny or stuffy nose (nasal congestion).  Postnasal drip.  Itchy nose.  Tearing of the eyes.  Trouble sleeping.  Daytime sleepiness.  How is this diagnosed? This condition may be diagnosed based on:  Your medical  history.  A physical exam.  Tests to check for related conditions, such as: ? Asthma. ? Pink eye. ? Ear infection. ? Upper respiratory infection.  Tests to find out which allergens trigger your symptoms. These may include skin or blood tests.  How is this treated? There is no cure for this condition, but treatment can help control symptoms. Treatment may include:  Taking medicines that block allergy symptoms, such as antihistamines. Medicine may be given as a shot, nasal spray, or pill.  Avoiding the allergen.  Desensitization. This treatment involves getting ongoing shots until your body becomes less sensitive to the allergen. This treatment may be done if other treatments do not help.  If taking medicine and avoiding the allergen does not work, new, stronger medicines may be prescribed.  Follow these instructions at home:  Find out what you are allergic to. Common allergens include smoke, dust, and pollen.  Avoid the things you are allergic to. These are some things you can do to help avoid allergens: ? Replace carpet with wood, tile, or vinyl flooring. Carpet can trap dander and dust. ? Do not smoke. Do not allow smoking in your home. ? Change your heating and air conditioning filter at least once a month. ? During allergy season:  Keep windows closed as much as possible.  Plan outdoor activities when pollen counts are lowest. This is usually during the evening hours.  When coming indoors, change clothing and shower before sitting on furniture or bedding.  Take over-the-counter and prescription medicines only as told by your health care provider.  Keep all   follow-up visits as told by your health care provider. This is important. Contact a health care provider if:  You have a fever.  You develop a persistent cough.  You make whistling sounds when you breathe (you wheeze).  Your symptoms interfere with your normal daily activities. Get help right away if:  You  have shortness of breath. Summary  This condition can be managed by taking medicines as directed and avoiding allergens.  Contact your health care provider if you develop a persistent cough or fever.  During allergy season, keep windows closed as much as possible. This information is not intended to replace advice given to you by your health care provider. Make sure you discuss any questions you have with your health care provider. Document Released: 10/11/2000 Document Revised: 02/24/2016 Document Reviewed: 02/24/2016 Elsevier Interactive Patient Education  2018 Elsevier Inc.  

## 2017-02-02 NOTE — Progress Notes (Signed)
  Chief Complaint  Patient presents with  . URI    x 2 1/2 weeks, pressure in head/ears, mucus light clear/tannish, coughing, tried sudafed, xyzal and flonase with some relief.  no fevers and some nausea due to the mucus drainage    HPI  Pt has clear nasal drainage Sinus fullness  No fevers  Some cough Tried sudafed, xyzal and flonse with some relief [place]  Past Medical History:  Diagnosis Date  . Hypertension     Current Outpatient Medications  Medication Sig Dispense Refill  . chlorthalidone (HYGROTON) 25 MG tablet Take 12.5 mg by mouth daily.    . montelukast (SINGULAIR) 10 MG tablet Take 1 tablet (10 mg total) by mouth at bedtime. 30 tablet 3   No current facility-administered medications for this visit.     Allergies: No Known Allergies  No past surgical history on file.  Social History   Socioeconomic History  . Marital status: Married    Spouse name: None  . Number of children: None  . Years of education: None  . Highest education level: None  Social Needs  . Financial resource strain: None  . Food insecurity - worry: None  . Food insecurity - inability: None  . Transportation needs - medical: None  . Transportation needs - non-medical: None  Occupational History  . None  Tobacco Use  . Smoking status: Never Smoker  . Smokeless tobacco: Never Used  Substance and Sexual Activity  . Alcohol use: No    Comment: occasional   . Drug use: None  . Sexual activity: None  Other Topics Concern  . None  Social History Narrative  . None    Family History  Problem Relation Age of Onset  . Hypertension Father   . Cancer Paternal Grandmother        melanoma and breast cancer  . Cancer Paternal Grandfather        bladder and colon cancer     ROS Review of Systems See HPI Constitution: No fevers or chills No malaise No diaphoresis Skin: No rash or itching Eyes: no blurry vision, no double vision GU: no dysuria or hematuria Neuro:  no dizziness or headaches * all others reviewed and negative   Objective: Vitals:   02/02/17 1637  BP: 130/76  Pulse: 88  Resp: 17  Temp: 98.6 F (37 C)  TempSrc: Oral  SpO2: 98%  Weight: 194 lb 3.2 oz (88.1 kg)  Height: 6\' 1"  (1.854 m)  HC: 17" (43.2 cm)    Physical Exam General: alert, oriented, in NAD Head: normocephalic, atraumatic, no sinus tenderness Eyes: EOM intact, no scleral icterus or conjunctival injection Ears: TM clear bilaterally Nose: mucosa nonerythematous, nonedematous Throat: no pharyngeal exudate or erythema Lymph: no posterior auricular, submental or cervical lymph adenopathy Heart: normal rate, normal sinus rhythm, no murmurs Lungs: clear to auscultation bilaterally, no wheezing   Assessment and Plan Waylan Rocherhaddeus was seen today for uri.  Diagnoses and all orders for this visit:  Non-seasonal allergic rhinitis due to other allergic trigger- will try singulair and also refer for testing -     Ambulatory referral to Allergy  Other orders -     montelukast (SINGULAIR) 10 MG tablet; Take 1 tablet (10 mg total) by mouth at bedtime.     Izack Hoogland A Evelynn Hench

## 2017-02-05 DIAGNOSIS — I10 Essential (primary) hypertension: Secondary | ICD-10-CM

## 2017-02-14 ENCOUNTER — Encounter: Payer: Self-pay | Admitting: Neurology

## 2017-02-14 ENCOUNTER — Ambulatory Visit: Payer: 59 | Admitting: Neurology

## 2017-02-14 VITALS — BP 118/80 | HR 67 | Ht 73.0 in | Wt 190.5 lb

## 2017-02-14 DIAGNOSIS — G43709 Chronic migraine without aura, not intractable, without status migrainosus: Secondary | ICD-10-CM | POA: Diagnosis not present

## 2017-02-14 MED ORDER — TOPIRAMATE ER 25 MG PO CAP24: 1.0000 | ORAL_CAPSULE | Freq: Every day | ORAL | 0 refills | Status: DC

## 2017-02-14 MED ORDER — TOPIRAMATE ER 25 MG PO CAP24: 25.0000 mg | ORAL_CAPSULE | Freq: Every day | ORAL | 5 refills | Status: DC

## 2017-02-14 NOTE — Addendum Note (Signed)
Addended by: Vivien RotaRIVER, Guiliana Shor H on: 8/46/96291/16/2019 03:01 PM   Modules accepted: Orders

## 2017-02-14 NOTE — Progress Notes (Signed)
[facility] Neurology Division Clinic Note - Initial Visit   Date: 02/14/17  Joshua Wise MRN: [id number] DOB: 07-23-1990   Dear Dr. Leretha Pol:  Thank you for your kind referral of Joshua Wise for consultation of migraines. Although his history is well known to you, please allow [place] to reiterate it for the purpose of our medical record. The patient was accompanied to the clinic by wife who also provides collateral information.     History of Present Illness: Joshua Wise is a 27 y.o. right-handed Caucasian male with hypertension presenting for evaluation of migraines.    Starting around 2014, he started having left sided pounding headaches which occur about 1-2 times per week, lasting 3-4 hours.  If the headaches occur in the morning or night, he tries to lay down for an hour and take ibuprofen which usually alleviates pain within 3-4 hours.  He denies any visual symptoms or warning.  He has some photophobia and nausea, only when severe.  Headaches are not worse with sneezing, coughing, or laughing.  He has takes iburpofen twice per week, which helps.  Stress can be a trigger - he has not noticed any other triggers such as lack of sleep, certain foods/smells, etc.  He drink about 1 cup of coffee daily, rarely two. No other caffienated beverages.   He went to the ER in July 2018 with severe migraine which he was unable to alleviate with rest because he had a busy day at work.  Associated with the headache, he developed nausea, right facial numbness and inability to get his words out.  Symptoms lasted about 2 hours.  CT head and MRI brain was unremarkable.    Father and paternal aunt have migraines.   Out-side paper records, electronic medical record, and images have been reviewed where available and summarized as:  CT head 08/07/2016:  1. Negative CT of the head. No acute or focal lesion to explain the patient's acute symptoms. 2. ASPECTS is 10/10  These results were called  by telephone at the time of interpretation on 08/07/2016 at 9:55 Am to Dr. Benjiman Core , who verbally acknowledged these results.  MRI brain wo contrast 08/07/2016:  Negative MRI of the head  Past Medical History:  Diagnosis Date  . Hypertension     Past Surgical History:  Procedure Laterality Date  . None       Medications:  Outpatient Encounter Medications as of 02/14/2017  Medication Sig  . chlorthalidone (HYGROTON) 25 MG tablet Take 12.5 mg by mouth daily.  . montelukast (SINGULAIR) 10 MG tablet Take 1 tablet (10 mg total) by mouth at bedtime.  . Topiramate ER (TROKENDI XR) 25 MG CP24 Take 25 mg by mouth daily.   No facility-administered encounter medications on file as of 02/14/2017.      Allergies: No Known Allergies  Family History: Family History  Problem Relation Age of Onset  . Healthy Mother   . Hypertension Father   . Migraines Father   . Cancer Paternal Grandmother        melanoma and breast cancer  . Cancer Paternal Grandfather        bladder and colon cancer    Social History: Social History   Tobacco Use  . Smoking status: Never Smoker  . Smokeless tobacco: Never Used  Substance Use Topics  . Alcohol use: No    Comment: occasional, 1-2 nights per week (4-6 wine/beer)   . Drug use: No   Social History   Social History Narrative  Lives with wife in a one story home with a basement.  No children.     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     Review of Systems:  CONSTITUTIONAL: No fevers, chills, night sweats, or weight loss.   EYES: No visual changes or eye pain ENT: No hearing changes.  No history of nose bleeds.   RESPIRATORY: No cough, wheezing and shortness of breath.   CARDIOVASCULAR: Negative for chest pain, and palpitations.   GI: Negative for abdominal discomfort, blood in stools or black stools.  No recent change in bowel habits.   GU:  No history of incontinence.   MUSCLOSKELETAL: No history of joint  pain or swelling.  No myalgias.   SKIN: Negative for lesions, rash, and itching.   HEMATOLOGY/ONCOLOGY: Negative for prolonged bleeding, bruising easily, and swollen nodes.  No history of cancer.   ENDOCRINE: Negative for cold or heat intolerance, polydipsia or goiter.   PSYCH:  No depression or anxiety symptoms.   NEURO: As Above.   Vital Signs:  BP 118/80   Pulse 67   Ht 6\' 1"  (1.854 m)   Wt 190 lb 8 oz (86.4 kg)   SpO2 97%   BMI 25.13 kg/m    General Medical Exam:   General:  Well appearing, comfortable.   Eyes/ENT: see cranial nerve examination.   Neck: No masses appreciated.  Full range of motion without tenderness.  No carotid bruits. Respiratory:  Clear to auscultation, good air entry bilaterally.   Cardiac:  Regular rate and rhythm, no murmur.   Extremities:  No deformities, edema, or skin discoloration.  Skin:  No rashes or lesions.  Neurological Exam: MENTAL STATUS including orientation to time, place, person, recent and remote memory, attention span and concentration, language, and fund of knowledge is normal.  Speech is not dysarthric.  CRANIAL NERVES: II:  No visual field defects.  Unremarkable fundi.   III-IV-VI: Pupils equal round and reactive to light.  Normal conjugate, extra-ocular eye movements in all directions of gaze.  No nystagmus.  No ptosis.   V:  Normal facial sensation.   VII:  Normal facial symmetry and movements.  No pathologic facial reflexes.  VIII:  Normal hearing and vestibular function.   IX-X:  Normal palatal movement.   XI:  Normal shoulder shrug and head rotation.   XII:  Normal tongue strength and range of motion, no deviation or fasciculation.  MOTOR: Motor strength is 5/5 througthout.  No pronator drift.  Tone is normal.    MSRs:   Reflexes are 2+/4 throughout.  SENSORY:  Normal and symmetric perception of light touch, pinprick, vibration, and proprioception.   COORDINATION/GAIT: Normal finger-to- nose-finger and heel-to-shin.   Intact rapid alternating movements bilaterally.   Gait narrow based and stable. Tandem and stressed gait intact.    IMPRESSION: Chronic migraine without aura.    CT head and MRI brain was reviewed and does not show any worrisome findings  Start Trokendi 25mg  daily.  Side effects discussed.  If insurance will not approve, he will need to try topirmate 25mg  daily  Limit OTC rescue medications to twice per week  Keep headache dairy  Return to clinic in 4 months.  Thank you for allowing me to participate in patient's care.  If I can answer any additional questions, I would be pleased to do so.    Sincerely,    Donika K. Allena KatzPatel, DO

## 2017-02-14 NOTE — Patient Instructions (Signed)
Start Trokendi 25mg  daily  Return to clinic in 4 months

## 2017-02-15 ENCOUNTER — Telehealth: Payer: Self-pay | Admitting: Neurology

## 2017-02-15 ENCOUNTER — Encounter: Payer: Self-pay | Admitting: *Deleted

## 2017-02-15 ENCOUNTER — Other Ambulatory Visit: Payer: Self-pay | Admitting: *Deleted

## 2017-02-15 MED ORDER — TOPIRAMATE 25 MG PO TABS: 25.0000 mg | ORAL_TABLET | Freq: Every day | ORAL | 5 refills | Status: DC

## 2017-02-15 MED ORDER — TOPIRAMATE 25 MG PO TABS: 25.0000 mg | ORAL_TABLET | Freq: Two times a day (BID) | ORAL | 3 refills | Status: DC

## 2017-02-15 NOTE — Telephone Encounter (Signed)
Rx sent and My Chart message sent to patient.

## 2017-02-15 NOTE — Telephone Encounter (Signed)
Please inform pt that insurance will not cover Trokendi as he has not tried topiramate.  New Rx send for topiramate 25mg  daily.    Pascual Mantel K. Allena KatzPatel, DO

## 2017-03-15 ENCOUNTER — Encounter: Payer: Self-pay | Admitting: Neurology

## 2017-03-23 ENCOUNTER — Ambulatory Visit: Payer: 59 | Admitting: Allergy

## 2017-03-29 ENCOUNTER — Encounter: Payer: Self-pay | Admitting: Neurology

## 2017-04-05 ENCOUNTER — Ambulatory Visit: Payer: 59 | Admitting: Allergy

## 2017-04-05 ENCOUNTER — Encounter: Payer: Self-pay | Admitting: Allergy

## 2017-04-05 VITALS — BP 126/72 | HR 81 | Temp 98.2°F | Resp 18 | Ht 73.5 in | Wt 188.4 lb

## 2017-04-05 DIAGNOSIS — J309 Allergic rhinitis, unspecified: Secondary | ICD-10-CM

## 2017-04-05 DIAGNOSIS — H1010 Acute atopic conjunctivitis, unspecified eye: Secondary | ICD-10-CM | POA: Diagnosis not present

## 2017-04-05 MED ORDER — OLOPATADINE HCL 0.7 % OP SOLN: 1.0000 [drp] | Freq: Every day | OPHTHALMIC | 5 refills | Status: DC

## 2017-04-05 NOTE — Patient Instructions (Addendum)
Allergic rhinoconjunctivitis    - environmental allergy skin testing today is positive to grasses, weeds, trees, dust mites, molds, cockroach    - allergen avoidance measures discussed and handouts provided    - daily antihistamine:  Xyzal 5mg  or Allegra 180mg      - for nasal congestion use Flonase 2 sprays each nostril daily. Use for 1-2 weeks at time before stopping once symptoms improve    - for itchy/water/red eyes use Pazeo or Pataday 1 drop each eye daily as needed    - nasal saline rinses daily as needed prior to use of nasal sprays    - allergen immunotherapy discussed including protocol, benefits and risks.   Informational handout provided including insurance codes.  Let us know if you would like to proceed with this therapeutic option.    Follow-up 4-6 months or sooner if needed

## 2017-04-05 NOTE — Progress Notes (Signed)
New Patient Note  RE: Joshua Wise MRN: [id number] DOB: 1990-09-22 Date of Office Visit: 04/05/2017  Referring provider: Myles Lipps, MD Primary care provider: Myles Lipps, MD  Chief Complaint: allergy symptoms  History of present illness: Joshua Wise is a 27 y.o. male presenting today for consultation for environmental allergies.    He moved to [place] from [place], [place] about a year ago and feels his allergy symptoms have been getting worse since moving. He reports symptoms of nasal congestion and drainage, sinus HA, watery eyes, rare sneezing.  Symptoms have been year-round and worse in the spring and fall.  He has tried [employer] which does help somewhat as well as OTC Xyzal.   He does feel like his right nares has always been more difficult to breathe through since having a childhood injury to nose that was never evaluated.   He has not had allergy testing done before.    He denies a history of asthma or food allergy.  He had childhood eczema which is no longer an issue.    Review of systems: Review of Systems  Constitutional: Negative for chills, fever and malaise/fatigue.  HENT: Positive for congestion. Negative for ear discharge, ear pain, nosebleeds, sinus pain, sore throat and tinnitus.   Eyes: Negative for pain, discharge and redness.  Respiratory: Negative for cough, shortness of breath and wheezing.   Cardiovascular: Negative for chest pain.  Gastrointestinal: Negative for abdominal pain, constipation, diarrhea, heartburn, nausea and vomiting.  Musculoskeletal: Negative for joint pain.  Skin: Negative for itching and rash.  Neurological: Positive for headaches.    All other systems negative unless noted above in HPI  Past medical history: Past Medical History:  Diagnosis Date  . Eczema   . Hypertension     Past surgical history: Past Surgical History:  Procedure Laterality Date  . None      Family history:  Family History  Problem  Relation Age of Onset  . Healthy Mother   . Hypertension Father   . Migraines Father   . Cancer Paternal Grandmother        melanoma and breast cancer  . Cancer Paternal Grandfather        bladder and colon cancer    Social history:  Social History Narrative   Lives with wife in a one story home with a basement.  Home does not have carpeting.  There is gas heating and central cooling.  Dogs in the home.  He is an Scientist, water quality    Medication List: Allergies as of 04/05/2017   No Known Allergies     Medication List        Accurate as of 04/05/17  4:59 PM. Always use your most recent med list.          chlorthalidone 25 MG tablet Commonly known as:  HYGROTON Take 12.5 mg by mouth daily.   Olopatadine HCl 0.7 % Soln Commonly known as:  PAZEO Place 1 drop into both eyes daily.       Known medication allergies: No Known Allergies   Physical examination: Blood pressure 126/72, pulse 81, temperature 98.2 F (36.8 C), resp. rate 18, height 6' 1.5" (1.867 m), weight 188 lb 6.4 oz (85.5 kg), SpO2 96 %.  General: Alert, interactive, in no acute distress. HEENT: PERRLA, TMs pearly gray, turbinates moderately edematous without discharge R>L, post-pharynx non erythematous. Neck: Supple without lymphadenopathy. Lungs: Clear to auscultation without wheezing, rhonchi or rales. {no increased work of breathing. CV: Normal  S1, S2 without murmurs. Abdomen: Nondistended, nontender. Skin: Warm and dry, without lesions or rashes. Extremities:  No clubbing, cyanosis or edema. Neuro:   Grossly intact.  Diagnositics/Labs:  Allergy testing: environmental skin prick testing is positive to grasses, weeds, trees, dust mites Intradermal testing is positive to mold mix 1, 2, 4 and cockroach Allergy testing results were read and interpreted by provider, documented by clinical staff.   Assessment and plan:   Allergic rhinoconjunctivitis    - environmental allergy skin testing today is  positive to grasses, weeds, trees, dust mites, molds, cockroach    - allergen avoidance measures discussed and handouts provided    - daily antihistamine:  Xyzal 5mg  or Allegra 180mg      - for nasal congestion use Flonase 2 sprays each nostril daily. Use for 1-2 weeks at time before stopping once symptoms improve    - for itchy/water/red eyes use Pazeo or Pataday 1 drop each eye daily as needed    - nasal saline rinses daily as needed prior to use of nasal sprays    - allergen immunotherapy discussed including protocol, benefits and risks.   Informational handout provided including insurance codes.  Let us know if you would like to proceed with this therapeutic option.    Follow-up 4-6 months or sooner if needed  I appreciate the opportunity to take part in Joshua Wise's care. Please do not hesitate to contact me with questions.  Sincerely,   Margo AyeShaylar Lemya Greenwell, MD Allergy/Immunology [facility]

## 2017-04-06 ENCOUNTER — Other Ambulatory Visit: Payer: Self-pay

## 2017-04-06 MED ORDER — OLOPATADINE HCL 0.1 % OP SOLN: 1.0000 [drp] | Freq: Every day | OPHTHALMIC | 5 refills | Status: DC

## 2017-04-06 NOTE — Telephone Encounter (Signed)
Received fax stating that Joshua Wise is not covered by insurance. Generic Patanol is an alternative, per Dr. Delorse LekPadgett ok to send.

## 2017-04-09 ENCOUNTER — Telehealth: Payer: Self-pay

## 2017-04-09 ENCOUNTER — Other Ambulatory Visit: Payer: Self-pay

## 2017-04-09 NOTE — Telephone Encounter (Signed)
pts insurance does not cover pazeo but will cover generic patanol is it ok to change?  Please advise

## 2017-04-10 ENCOUNTER — Other Ambulatory Visit: Payer: Self-pay

## 2017-04-10 MED ORDER — OLOPATADINE HCL 0.1 % OP SOLN: 1.0000 [drp] | Freq: Two times a day (BID) | OPHTHALMIC | 5 refills | Status: DC | PRN

## 2017-04-10 NOTE — Telephone Encounter (Signed)
rx sent in 

## 2017-04-10 NOTE — Telephone Encounter (Signed)
Yes patanol ok.   1 drop each eye twice daily prn itch/watery/red eyes

## 2017-06-04 ENCOUNTER — Ambulatory Visit: Payer: 59 | Admitting: Family Medicine

## 2017-06-04 NOTE — Progress Notes (Deleted)
  No chief complaint on file.   HPI  4 review of systems  Past Medical History:  Diagnosis Date  . Eczema   . Hypertension     Current Outpatient Medications  Medication Sig Dispense Refill  . chlorthalidone (HYGROTON) 25 MG tablet Take 12.5 mg by mouth daily.    Marland Kitchen olopatadine (PATANOL) 0.1 % ophthalmic solution Place 1 drop into both eyes daily. 5 mL 5  . olopatadine (PATANOL) 0.1 % ophthalmic solution Place 1 drop into both eyes 2 (two) times daily as needed for allergies. 5 mL 5   No current facility-administered medications for this visit.     Allergies: No Known Allergies  Past Surgical History:  Procedure Laterality Date  . None      Social History   Socioeconomic History  . Marital status: Married    Spouse name: Not on file  . Number of children: 0  . Years of education: 7  . Highest education level: Not on file  Occupational History  . Occupation: Chief Strategy Officer  . Financial resource strain: Not on file  . Food insecurity:    Worry: Not on file    Inability: Not on file  . Transportation needs:    Medical: Not on file    Non-medical: Not on file  Tobacco Use  . Smoking status: Never Smoker  . Smokeless tobacco: Never Used  Substance and Sexual Activity  . Alcohol use: No    Comment: occasional, 1-2 nights per week (4-6 wine/beer)   . Drug use: No  . Sexual activity: Not on file  Lifestyle  . Physical activity:    Days per week: Not on file    Minutes per session: Not on file  . Stress: Not on file  Relationships  . Social connections:    Talks on phone: Not on file    Gets together: Not on file    Attends religious service: Not on file    Active member of club or organization: Not on file    Attends meetings of clubs or organizations: Not on file    Relationship status: Not on file  Other Topics Concern  . Not on file  Social History Narrative   Lives with wife in a one story home with a basement.  No children.     Works in Airline pilot  - building supples    Education: some college.     Family History  Problem Relation Age of Onset  . Healthy Mother   . Hypertension Father   . Migraines Father   . Cancer Paternal Grandmother        melanoma and breast cancer  . Cancer Paternal Grandfather        bladder and colon cancer     ROS Review of Systems See HPI Constitution: No fevers or chills No malaise No diaphoresis Skin: No rash or itching Eyes: no blurry vision, no double vision GU: no dysuria or hematuria Neuro: no dizziness or headaches * all others reviewed and negative   Objective: There were no vitals filed for this visit.  Physical Exam  Assessment and Plan There are no diagnoses linked to this encounter.   Joshua Wise P [employer]

## 2017-06-06 ENCOUNTER — Ambulatory Visit: Payer: 59 | Admitting: Neurology

## 2017-06-07 ENCOUNTER — Other Ambulatory Visit: Payer: Self-pay

## 2017-06-07 ENCOUNTER — Ambulatory Visit: Payer: 59 | Admitting: Family Medicine

## 2017-06-07 ENCOUNTER — Encounter: Payer: Self-pay | Admitting: Family Medicine

## 2017-06-07 VITALS — BP 120/70 | HR 65 | Temp 98.6°F | Ht 73.23 in | Wt 186.0 lb

## 2017-06-07 DIAGNOSIS — S161XXA Strain of muscle, fascia and tendon at neck level, initial encounter: Secondary | ICD-10-CM | POA: Diagnosis not present

## 2017-06-07 DIAGNOSIS — M542 Cervicalgia: Secondary | ICD-10-CM | POA: Diagnosis not present

## 2017-06-07 NOTE — Patient Instructions (Signed)
     IF you received an x-ray today, you will receive an invoice from [facility]. Please contact [facility] at [phone] with questions or concerns regarding your invoice.   IF you received labwork today, you will receive an invoice from [facility]. Please contact [facility] at [phone] with questions or concerns regarding your invoice.   Our billing staff will not be able to assist you with questions regarding bills from these companies.  You will be contacted with the lab results as soon as they are available. The fastest way to get your results is to activate your My Chart account. Instructions are located on the last page of this paperwork. If you have not heard from us regarding the results in 2 weeks, please contact this office.     

## 2017-06-07 NOTE — Progress Notes (Signed)
   5/9/201910:55 AM  Joshua Wise 1990/05/24, 27 y.o. male [id number]  Chief Complaint  Patient presents with  . Sore Throat    while lifting weight on Saturday strained his neck that resulted into sore throat. Has been sore since Sunday when he attempted to lift weights again    HPI:   Patient is a 27 y.o. male  who presents today for sore throat but when he really thinks about it, he is not having a sore throat but more of neck pain. He states that pain started along left side of neck while he was bench pressing several days ago.  He has been taking motrin every 6 hours Has been resting from exercise Pain worse when he looks up and away Today much better  Fall Risk  06/07/2017 02/14/2017 11/30/2016  Falls in the past year? No No No     Depression screen [facility] 2/9 06/07/2017 02/02/2017 11/30/2016  Decreased Interest 0 0 0  Down, Depressed, Hopeless 0 0 0  PHQ - 2 Score 0 0 0    No Known Allergies  Prior to Admission medications   Medication Sig Start Date End Date Taking? Authorizing Provider  chlorthalidone (HYGROTON) 25 MG tablet Take 12.5 mg by mouth daily.   Yes [provider]  topiramate (TOPAMAX) 25 MG tablet Take 25 mg by mouth 2 (two) times daily.   Yes [provider]  olopatadine (PATANOL) 0.1 % ophthalmic solution Place 1 drop into both eyes 2 (two) times daily as needed for allergies. 04/10/17   Marcelyn Bruins, MD    Past Medical History:  Diagnosis Date  . Eczema   . Hypertension     Past Surgical History:  Procedure Laterality Date  . None      Social History   Tobacco Use  . Smoking status: Never Smoker  . Smokeless tobacco: Never Used  Substance Use Topics  . Alcohol use: No    Comment: occasional, 1-2 nights per week (4-6 wine/beer)     Family History  Problem Relation Age of Onset  . Healthy Mother   . Hypertension Father   . Migraines Father   . Cancer Paternal Grandmother        melanoma and breast cancer  .  Cancer Paternal Grandfather        bladder and colon cancer    ROS Per hpi  OBJECTIVE:  Blood pressure 120/70, pulse 65, temperature 98.6 F (37 C), temperature source Oral, height 6' 1.23" (1.86 m), weight 186 lb (84.4 kg), SpO2 96 %.  Physical Exam  Constitutional: He appears well-developed and well-nourished.  HENT:  Head: Normocephalic and atraumatic.  Mouth/Throat: Oropharynx is clear and moist.  Neck: Normal range of motion. Muscular tenderness (left SCM) present.  Lymphadenopathy:    He has no cervical adenopathy.  Nursing note and vitals reviewed.    ASSESSMENT and PLAN  1. Strain of neck muscle, initial encounter 2. Neck pain on left side Discussed supportive measures, cont with motrin, provided handout with stretches. RTC precautions given.  Return if symptoms worsen or fail to improve.    Myles Lipps, MD Primary Care at [facility] [address] [place], [place] [address] Ph.  [phone] Fax [phone]

## 2017-06-20 ENCOUNTER — Encounter: Payer: Self-pay | Admitting: Family Medicine

## 2017-09-14 ENCOUNTER — Encounter: Payer: Self-pay | Admitting: Neurology

## 2017-09-14 ENCOUNTER — Ambulatory Visit: Payer: 59 | Admitting: Neurology

## 2017-09-14 VITALS — BP 110/84 | HR 57 | Ht 73.0 in | Wt 185.0 lb

## 2017-09-14 DIAGNOSIS — G43709 Chronic migraine without aura, not intractable, without status migrainosus: Secondary | ICD-10-CM

## 2017-09-14 MED ORDER — SUMATRIPTAN SUCCINATE 25 MG PO TABS: ORAL_TABLET | ORAL | 0 refills | Status: DC

## 2017-09-14 NOTE — Progress Notes (Signed)
Follow-up Visit   Date: 09/14/17    Joshua Wise MRN: [id number] DOB: 02/22/1990   Interim History: Joshua Wise is a 27 y.o. right-handed Caucasian male with hypertension returning to the clinic for follow-up of migraines.  The patient was accompanied to the clinic by self.  History of present illness: Starting around 2014, he started having left sided pounding headaches which occur about 1-2 times per week, lasting 3-4 hours.  If the headaches occur in the morning or night, he tries to lay down for an hour and take ibuprofen which usually alleviates pain within 3-4 hours.  He denies any visual symptoms or warning.  He has some photophobia and nausea, only when severe.  Headaches are not worse with sneezing, coughing, or laughing.  He has takes iburpofen twice per week, which helps.  Stress can be a trigger - he has not noticed any other triggers such as lack of sleep, certain foods/smells, etc.  He drink about 1 cup of coffee daily, rarely two. No other caffienated beverages.   He went to the ER in July 2018 with severe migraine which he was unable to alleviate with rest because he had a busy day at work.  Associated with the headache, he developed nausea, right facial numbness and inability to get his words out.  Symptoms lasted about 2 hours.  CT head and MRI brain was unremarkable.    Father and paternal aunt have migraines.   UPDATE 09/14/2017:  He is tolerating Trokendi 25mg  very well and now only has migraines about 1-2 times per month.   Occasionally when dehydrated, he has some tingling in the hands. Duration is usually 8-12 hours and usually at the end of the day and has to go to sleep, but still wakes up with a headache.  He tries ibuprofen which does not provide any relief.    Medications:  Current Outpatient Medications on File Prior to Visit  Medication Sig Dispense Refill  . chlorthalidone (HYGROTON) 25 MG tablet Take 12.5 mg by mouth daily.    Marland Kitchen. olopatadine  (PATANOL) 0.1 % ophthalmic solution Place 1 drop into both eyes 2 (two) times daily as needed for allergies. 5 mL 5  . topiramate (TOPAMAX) 25 MG tablet Take 25 mg by mouth 2 (two) times daily.    Marland Kitchen. TROKENDI XR 25 MG CP24 TAKE 1 CAPSULE (25 MG) BY MOUTH DAILY.  5   No current facility-administered medications on file prior to visit.     Allergies: No Known Allergies  Review of Systems:  CONSTITUTIONAL: No fevers, chills, night sweats, or weight loss.  EYES: No visual changes or eye pain ENT: No hearing changes.  No history of nose bleeds.   RESPIRATORY: No cough, wheezing and shortness of breath.   CARDIOVASCULAR: Negative for chest pain, and palpitations.   GI: Negative for abdominal discomfort, blood in stools or black stools.  No recent change in bowel habits.   GU:  No history of incontinence.   MUSCLOSKELETAL: No history of joint pain or swelling.  No myalgias.   SKIN: Negative for lesions, rash, and itching.   ENDOCRINE: Negative for cold or heat intolerance, polydipsia or goiter.   PSYCH:  No depression or anxiety symptoms.   NEURO: As Above.   Vital Signs:  BP 110/84   Pulse (!) 57   Ht 6\' 1"  (1.854 m)   Wt 185 lb (83.9 kg)   SpO2 99%   BMI 24.41 kg/m    General Medical Exam:  General:  Well appearing, comfortable  Eyes/ENT: see cranial nerve examination.   Neck: No masses appreciated.  Full range of motion without tenderness.  No carotid bruits. Respiratory:  Clear to auscultation, good air entry bilaterally.   Cardiac:  Regular rate and rhythm, no murmur.   Ext:  No edema  Neurological Exam: MENTAL STATUS including orientation to time, place, person, recent and remote memory, attention span and concentration, language, and fund of knowledge is normal.  Speech is not dysarthric.  CRANIAL NERVES:  Pupils equal round and reactive to light.  Normal conjugate, extra-ocular eye movements in all directions of gaze.  No ptosis. Face is symmetric. Palate elevates  symmetrically.  Tongue is midline.  MOTOR:  Motor strength is 5/5 in all extremities  No pronator drift.  Tone is normal.    COORDINATION/GAIT:  Normal finger-to- nose-finger and heel-to-shin.  Intact rapid alternating movements bilaterally.  Gait narrow based and stable.   Data: CT head 08/07/2016:  1. Negative CT of the head. No acute or focal lesion to explain the patient's acute symptoms. 2. ASPECTS is 10/10  These results were called by telephone at the time of interpretation on 08/07/2016 at 9:55 Am to Dr. Benjiman CoreNATHAN PICKERING , who verbally acknowledged these results.  MRI brain wo contrast 08/07/2016:  Negative MRI of the head   IMPRESSION/PLAN: Chronic migraines without aura, improved frequency on Trokendi 25mg  daily now to 1-2 times per month.    - Continue Trokendi 25mg  daily  Episodic migraine without aura  - Migraine duration is 8-12 hour and does not respond to OTC NSAIDs  - Start imitrex 25mg  at headache onset, ok to repeat in 2 hours as needed  Return to clinic in 6 months   Thank you for allowing me to participate in patient's care.  If I can answer any additional questions, I would be pleased to do so.    Sincerely,    Allisyn Kunz K. Allena KatzPatel, DO

## 2017-09-14 NOTE — Patient Instructions (Addendum)
For severe migraine, start imitrex 25mg  at headache onset.  OK to repeat in 2 hours if pain persist.  Continue Trokendi 25mg  daily  Return to clinic in 6 months

## 2017-09-25 NOTE — Addendum Note (Signed)
Addended by: Lorrin MaisPADGETT, Lanya Bucks P on: 09/25/2017 05:50 PM   Modules accepted: Orders

## 2017-09-26 DIAGNOSIS — J3089 Other allergic rhinitis: Secondary | ICD-10-CM

## 2017-09-26 NOTE — Progress Notes (Signed)
VIALS EXP 09-27-18 

## 2017-09-27 DIAGNOSIS — J301 Allergic rhinitis due to pollen: Secondary | ICD-10-CM

## 2017-10-10 ENCOUNTER — Ambulatory Visit: Payer: 59 | Admitting: Allergy

## 2017-10-11 ENCOUNTER — Ambulatory Visit: Payer: 59 | Admitting: Allergy

## 2017-10-11 ENCOUNTER — Encounter: Payer: Self-pay | Admitting: Allergy

## 2017-10-11 ENCOUNTER — Ambulatory Visit: Payer: 59

## 2017-10-11 VITALS — BP 120/88 | HR 64 | Resp 16

## 2017-10-11 DIAGNOSIS — H1010 Acute atopic conjunctivitis, unspecified eye: Secondary | ICD-10-CM | POA: Diagnosis not present

## 2017-10-11 DIAGNOSIS — J309 Allergic rhinitis, unspecified: Secondary | ICD-10-CM | POA: Diagnosis not present

## 2017-10-11 MED ORDER — EPINEPHRINE 0.3 MG/0.3ML IJ SOAJ: 0.3000 mg | Freq: Once | INTRAMUSCULAR | 1 refills | Status: DC

## 2017-10-11 NOTE — Progress Notes (Signed)
Immunotherapy   Patient Details  Name: Joshua Wise MRN: [id number] Date of Birth: 03/24/1990  10/11/2017  Joshua Wise started injections for  Pollen & Mold-Dmite-CR. Following schedule: B  Frequency:2 times per week Epi-Pen:Prescription for Epi-Pen given Consent signed and patient instructions given. No problems after 30 minutes in the office.   Mariane DuvalHeather L Vernon 10/11/2017, 10:05 AM

## 2017-10-11 NOTE — Progress Notes (Signed)
Follow-up Note  RE: Joshua Wise MRN: [id number] DOB: May 12, 1990 Date of Office Visit: 10/11/2017   History of present illness: Joshua Wise is a 27 y.o. male presenting today for follow-up of allergic rhinoconjunctivitis.  He was last seen in the office on April 05, 2017 by myself for initial visit.  He denies any major health changes, surgeries or hospitalizations since his last visit. He states after first visit he was doing on xyzal, flonase daily and eye drops as needed.   He states he ran out of xyzal and in the several days he was out of his allergy symptoms returned with nasal congestion, red/watery eyes.  Before he could get back on Xyzal he changed to Claritin however he is now back on Xyzal after he finished out his Claritin supply.  He states he also switch Flonase to Nasacort and has been continuing use of Nasacort at this time. Due to his return of his allergy symptoms when he ran out of his medications he made the decision to go ahead and start on allergen immunotherapy which she received his first injection today.   Review of systems: Review of Systems  Constitutional: Negative for chills, fever and malaise/fatigue.  HENT: Positive for congestion. Negative for ear discharge, nosebleeds, sinus pain and sore throat.   Eyes: Negative for pain, discharge and redness.  Respiratory: Negative for cough, shortness of breath and wheezing.   Cardiovascular: Negative for chest pain.  Gastrointestinal: Negative for abdominal pain, constipation, diarrhea, heartburn, nausea and vomiting.  Musculoskeletal: Negative for joint pain.  Skin: Negative for itching and rash.  Neurological: Negative for headaches.    All other systems negative unless noted above in HPI  Past medical/social/surgical/family history have been reviewed and are unchanged unless specifically indicated below.  No changes  Medication List: Allergies as of 10/11/2017   No Known Allergies     Medication  List        Accurate as of 10/11/17 11:59 PM. Always use your most recent med list.          chlorthalidone 25 MG tablet Commonly known as:  HYGROTON Take 12.5 mg by mouth daily.   EPINEPHrine 0.3 mg/0.3 mL Soaj injection Commonly known as:  EPI-PEN Inject 0.3 mLs (0.3 mg total) into the muscle once for 1 dose.   olopatadine 0.1 % ophthalmic solution Commonly known as:  PATANOL Place 1 drop into both eyes 2 (two) times daily as needed for allergies.   SUMAtriptan 25 MG tablet Commonly known as:  IMITREX Take 1 tablet at headache onset.  May repeat in 2 hours if headache persists or recurs.   topiramate 25 MG tablet Commonly known as:  TOPAMAX Take 25 mg by mouth 2 (two) times daily.   TROKENDI XR 25 MG Cp24 Generic drug:  Topiramate ER TAKE 1 CAPSULE (25 MG) BY MOUTH DAILY.       Known medication allergies: No Known Allergies   Physical examination: Blood pressure 120/88, pulse 64, resp. rate 16.  General: Alert, interactive, in no acute distress. HEENT: PERRLA, TMs pearly gray, turbinates minimally edematous without discharge, post-pharynx non erythematous. Neck: Supple without lymphadenopathy. Lungs: Clear to auscultation without wheezing, rhonchi or rales. {no increased work of breathing. CV: Normal S1, S2 without murmurs. Abdomen: Nondistended, nontender. Skin: Warm and dry, without lesions or rashes. Extremities:  No clubbing, cyanosis or edema. Neuro:   Grossly intact.  Diagnositics/Labs: None today  Assessment and plan:   Allergic rhinoconjunctivitis    - continue avoidance  measures for grasses, weeds, trees, dust mites, molds, cockroach    - daily antihistamine:  Xyzal 5mg  or Allegra 180mg      - for nasal congestion use Flonase 2 sprays each nostril daily. Use for 1-2 weeks at time before stopping once symptoms improve    - for itchy/water/red eyes use Pazeo or Pataday 1 drop each eye daily as needed    - nasal saline rinses daily as needed prior  to use of nasal sprays    - allergen immunotherapy started today.  Continue injections twice a week as tolerated during build-up phase.  [facility] in case of allergic reaction.     Follow-up 4-6 months or sooner if needed  I appreciate the opportunity to take part in Joshua Wise's care. Please do not hesitate to contact me with questions.  Sincerely,   Margo AyeShaylar Scout Guyett, MD Allergy/Immunology [facility]

## 2017-10-11 NOTE — Patient Instructions (Addendum)
Allergic rhinoconjunctivitis    - continue avoidance measures for grasses, weeds, trees, dust mites, molds, cockroach    - daily antihistamine:  Xyzal 5mg  or Allegra 180mg      - for nasal congestion use Flonase 2 sprays each nostril daily. Use for 1-2 weeks at time before stopping once symptoms improve    - for itchy/water/red eyes use Pazeo or Pataday 1 drop each eye daily as needed    - nasal saline rinses daily as needed prior to use of nasal sprays    - allergen immunotherapy started today.  Continue injections twice a week as tolerated during build-up phase.  [facility] as needed use in case of allergic reaction.     Follow-up 4-6 months or sooner if needed

## 2017-10-12 MED ORDER — OLOPATADINE HCL 0.1 % OP SOLN: 1.0000 [drp] | Freq: Two times a day (BID) | OPHTHALMIC | 5 refills | Status: DC | PRN

## 2017-10-12 MED ORDER — EPINEPHRINE 0.3 MG/0.3ML IJ SOAJ: 0.3000 mg | Freq: Once | INTRAMUSCULAR | 1 refills | Status: AC

## 2017-10-16 ENCOUNTER — Ambulatory Visit (INDEPENDENT_AMBULATORY_CARE_PROVIDER_SITE_OTHER): Payer: 59 | Admitting: *Deleted

## 2017-10-16 DIAGNOSIS — J309 Allergic rhinitis, unspecified: Secondary | ICD-10-CM

## 2017-10-18 ENCOUNTER — Ambulatory Visit (INDEPENDENT_AMBULATORY_CARE_PROVIDER_SITE_OTHER): Payer: 59 | Admitting: *Deleted

## 2017-10-18 DIAGNOSIS — J309 Allergic rhinitis, unspecified: Secondary | ICD-10-CM | POA: Diagnosis not present

## 2017-10-21 ENCOUNTER — Other Ambulatory Visit: Payer: Self-pay | Admitting: Neurology

## 2017-10-26 ENCOUNTER — Ambulatory Visit (INDEPENDENT_AMBULATORY_CARE_PROVIDER_SITE_OTHER): Payer: 59

## 2017-10-26 DIAGNOSIS — J309 Allergic rhinitis, unspecified: Secondary | ICD-10-CM | POA: Diagnosis not present

## 2017-10-29 ENCOUNTER — Ambulatory Visit (INDEPENDENT_AMBULATORY_CARE_PROVIDER_SITE_OTHER): Payer: 59 | Admitting: *Deleted

## 2017-10-29 DIAGNOSIS — J309 Allergic rhinitis, unspecified: Secondary | ICD-10-CM

## 2017-10-31 ENCOUNTER — Ambulatory Visit (INDEPENDENT_AMBULATORY_CARE_PROVIDER_SITE_OTHER): Payer: 59 | Admitting: *Deleted

## 2017-10-31 DIAGNOSIS — J309 Allergic rhinitis, unspecified: Secondary | ICD-10-CM | POA: Diagnosis not present

## 2017-11-05 ENCOUNTER — Ambulatory Visit (INDEPENDENT_AMBULATORY_CARE_PROVIDER_SITE_OTHER): Payer: 59

## 2017-11-05 DIAGNOSIS — J309 Allergic rhinitis, unspecified: Secondary | ICD-10-CM

## 2017-11-07 ENCOUNTER — Ambulatory Visit (INDEPENDENT_AMBULATORY_CARE_PROVIDER_SITE_OTHER): Payer: 59 | Admitting: *Deleted

## 2017-11-07 DIAGNOSIS — J309 Allergic rhinitis, unspecified: Secondary | ICD-10-CM | POA: Diagnosis not present

## 2017-11-12 ENCOUNTER — Ambulatory Visit (INDEPENDENT_AMBULATORY_CARE_PROVIDER_SITE_OTHER): Payer: 59 | Admitting: *Deleted

## 2017-11-12 DIAGNOSIS — J309 Allergic rhinitis, unspecified: Secondary | ICD-10-CM | POA: Diagnosis not present

## 2017-11-14 ENCOUNTER — Ambulatory Visit (INDEPENDENT_AMBULATORY_CARE_PROVIDER_SITE_OTHER): Payer: 59 | Admitting: *Deleted

## 2017-11-14 DIAGNOSIS — J309 Allergic rhinitis, unspecified: Secondary | ICD-10-CM | POA: Diagnosis not present

## 2017-11-19 ENCOUNTER — Ambulatory Visit (INDEPENDENT_AMBULATORY_CARE_PROVIDER_SITE_OTHER): Payer: 59

## 2017-11-19 DIAGNOSIS — J309 Allergic rhinitis, unspecified: Secondary | ICD-10-CM | POA: Diagnosis not present

## 2017-11-22 ENCOUNTER — Ambulatory Visit (INDEPENDENT_AMBULATORY_CARE_PROVIDER_SITE_OTHER): Payer: 59

## 2017-11-22 DIAGNOSIS — J309 Allergic rhinitis, unspecified: Secondary | ICD-10-CM

## 2017-11-26 ENCOUNTER — Ambulatory Visit (INDEPENDENT_AMBULATORY_CARE_PROVIDER_SITE_OTHER): Payer: 59

## 2017-11-26 DIAGNOSIS — J309 Allergic rhinitis, unspecified: Secondary | ICD-10-CM | POA: Diagnosis not present

## 2017-11-28 ENCOUNTER — Ambulatory Visit (INDEPENDENT_AMBULATORY_CARE_PROVIDER_SITE_OTHER): Payer: 59 | Admitting: *Deleted

## 2017-11-28 DIAGNOSIS — J309 Allergic rhinitis, unspecified: Secondary | ICD-10-CM | POA: Diagnosis not present

## 2017-12-03 ENCOUNTER — Ambulatory Visit (INDEPENDENT_AMBULATORY_CARE_PROVIDER_SITE_OTHER): Payer: 59 | Admitting: *Deleted

## 2017-12-03 DIAGNOSIS — J309 Allergic rhinitis, unspecified: Secondary | ICD-10-CM | POA: Diagnosis not present

## 2017-12-05 ENCOUNTER — Ambulatory Visit (INDEPENDENT_AMBULATORY_CARE_PROVIDER_SITE_OTHER): Payer: 59 | Admitting: *Deleted

## 2017-12-05 DIAGNOSIS — J309 Allergic rhinitis, unspecified: Secondary | ICD-10-CM | POA: Diagnosis not present

## 2017-12-10 ENCOUNTER — Ambulatory Visit (INDEPENDENT_AMBULATORY_CARE_PROVIDER_SITE_OTHER): Payer: 59 | Admitting: *Deleted

## 2017-12-10 DIAGNOSIS — J309 Allergic rhinitis, unspecified: Secondary | ICD-10-CM

## 2017-12-12 ENCOUNTER — Ambulatory Visit (INDEPENDENT_AMBULATORY_CARE_PROVIDER_SITE_OTHER): Payer: 59

## 2017-12-12 DIAGNOSIS — J309 Allergic rhinitis, unspecified: Secondary | ICD-10-CM

## 2017-12-19 ENCOUNTER — Ambulatory Visit (INDEPENDENT_AMBULATORY_CARE_PROVIDER_SITE_OTHER): Payer: 59 | Admitting: *Deleted

## 2017-12-19 DIAGNOSIS — J309 Allergic rhinitis, unspecified: Secondary | ICD-10-CM

## 2017-12-21 ENCOUNTER — Ambulatory Visit (INDEPENDENT_AMBULATORY_CARE_PROVIDER_SITE_OTHER): Payer: 59

## 2017-12-21 DIAGNOSIS — J309 Allergic rhinitis, unspecified: Secondary | ICD-10-CM

## 2017-12-24 ENCOUNTER — Ambulatory Visit (INDEPENDENT_AMBULATORY_CARE_PROVIDER_SITE_OTHER): Payer: 59 | Admitting: *Deleted

## 2017-12-24 DIAGNOSIS — J309 Allergic rhinitis, unspecified: Secondary | ICD-10-CM | POA: Diagnosis not present

## 2017-12-31 ENCOUNTER — Ambulatory Visit (INDEPENDENT_AMBULATORY_CARE_PROVIDER_SITE_OTHER): Payer: 59 | Admitting: *Deleted

## 2017-12-31 DIAGNOSIS — J309 Allergic rhinitis, unspecified: Secondary | ICD-10-CM

## 2018-01-07 ENCOUNTER — Ambulatory Visit (INDEPENDENT_AMBULATORY_CARE_PROVIDER_SITE_OTHER): Payer: 59

## 2018-01-07 DIAGNOSIS — J309 Allergic rhinitis, unspecified: Secondary | ICD-10-CM

## 2018-01-07 DIAGNOSIS — J301 Allergic rhinitis due to pollen: Secondary | ICD-10-CM | POA: Diagnosis not present

## 2018-01-14 ENCOUNTER — Ambulatory Visit (INDEPENDENT_AMBULATORY_CARE_PROVIDER_SITE_OTHER): Payer: 59 | Admitting: *Deleted

## 2018-01-14 DIAGNOSIS — J309 Allergic rhinitis, unspecified: Secondary | ICD-10-CM

## 2018-01-31 ENCOUNTER — Ambulatory Visit (INDEPENDENT_AMBULATORY_CARE_PROVIDER_SITE_OTHER): Payer: 59 | Admitting: *Deleted

## 2018-01-31 DIAGNOSIS — J309 Allergic rhinitis, unspecified: Secondary | ICD-10-CM | POA: Diagnosis not present

## 2018-02-04 ENCOUNTER — Ambulatory Visit (INDEPENDENT_AMBULATORY_CARE_PROVIDER_SITE_OTHER): Payer: 59

## 2018-02-04 DIAGNOSIS — J309 Allergic rhinitis, unspecified: Secondary | ICD-10-CM

## 2018-02-12 ENCOUNTER — Ambulatory Visit (INDEPENDENT_AMBULATORY_CARE_PROVIDER_SITE_OTHER): Payer: 59 | Admitting: *Deleted

## 2018-02-12 DIAGNOSIS — J309 Allergic rhinitis, unspecified: Secondary | ICD-10-CM

## 2018-02-20 ENCOUNTER — Ambulatory Visit (INDEPENDENT_AMBULATORY_CARE_PROVIDER_SITE_OTHER): Payer: 59

## 2018-02-20 DIAGNOSIS — J309 Allergic rhinitis, unspecified: Secondary | ICD-10-CM

## 2018-02-26 ENCOUNTER — Ambulatory Visit (INDEPENDENT_AMBULATORY_CARE_PROVIDER_SITE_OTHER): Payer: 59 | Admitting: *Deleted

## 2018-02-26 DIAGNOSIS — J309 Allergic rhinitis, unspecified: Secondary | ICD-10-CM | POA: Diagnosis not present

## 2018-03-05 ENCOUNTER — Ambulatory Visit (INDEPENDENT_AMBULATORY_CARE_PROVIDER_SITE_OTHER): Payer: 59

## 2018-03-05 DIAGNOSIS — J309 Allergic rhinitis, unspecified: Secondary | ICD-10-CM | POA: Diagnosis not present

## 2018-03-12 ENCOUNTER — Ambulatory Visit (INDEPENDENT_AMBULATORY_CARE_PROVIDER_SITE_OTHER): Payer: 59 | Admitting: *Deleted

## 2018-03-12 DIAGNOSIS — J309 Allergic rhinitis, unspecified: Secondary | ICD-10-CM

## 2018-03-16 NOTE — Progress Notes (Signed)
Follow-up Visit   Date: 03/18/18    Joshua Wise MRN: [id number] DOB: [birth year]   Interim History: Joshua Wise is a 28 y.o. right-handed Caucasian male with hypertension returning to the clinic for follow-up of migraines.  The patient was accompanied to the clinic by self.  History of present illness: Starting around 2014, he started having left sided pounding headaches which occur about 1-2 times per week, lasting 3-4 hours.  If the headaches occur in the morning or night, he tries to lay down for an hour and take ibuprofen which usually alleviates pain within 3-4 hours.  He denies any visual symptoms or warning.  He has some photophobia and nausea, only when severe.  Headaches are not worse with sneezing, coughing, or laughing.  He has takes iburpofen twice per week, which helps.  Stress can be a trigger - he has not noticed any other triggers such as lack of sleep, certain foods/smells, etc.  He drink about 1 cup of coffee daily, rarely two. No other caffienated beverages.   He went to the ER in July 2018 with severe migraine which he was unable to alleviate with rest because he had a busy day at work.  Associated with the headache, he developed nausea, right facial numbness and inability to get his words out.  Symptoms lasted about 2 hours.  CT head and MRI brain was unremarkable.    Father and paternal aunt have migraines.   UPDATE 09/14/2017:  He is tolerating Trokendi 25mg  very well and now only has migraines about 1-2 times per month.   Occasionally when dehydrated, he has some tingling in the hands. Duration is usually 8-12 hours and usually at the end of the day and has to go to sleep, but still wakes up with a headache.  He tries ibuprofen which does not provide any relief.    UPDATE 03/18/2018: He is here for 17-month visit.  At his last visit, he was doing well on Trokendi 25 mg daily which was controlling headaches to 1-2 per month.  His episodic migraines when  occurring, however, was lasting 8 to 12 hours and he was unable to get relief with over-the-counter NSAIDs.  He was offered Imitrex which has significantly helped with acute migraine.    Medications:  Current Outpatient Medications on File Prior to Visit  Medication Sig Dispense Refill  . chlorthalidone (HYGROTON) 25 MG tablet Take 12.5 mg by mouth daily.    Marland Kitchen olopatadine (PATANOL) 0.1 % ophthalmic solution Place 1 drop into both eyes 2 (two) times daily as needed for allergies. 5 mL 5  . SUMAtriptan (IMITREX) 25 MG tablet Take 1 tablet at headache onset.  May repeat in 2 hours if headache persists or recurs. 10 tablet 0  . TROKENDI XR 25 MG CP24 TAKE 1 CAPSULE (25 MG) BY MOUTH DAILY.  5   No current facility-administered medications on file prior to visit.     Allergies: No Known Allergies  Review of Systems:  CONSTITUTIONAL: No fevers, chills, night sweats, or weight loss.  EYES: No visual changes or eye pain ENT: No hearing changes.  No history of nose bleeds.   RESPIRATORY: No cough, wheezing and shortness of breath.   CARDIOVASCULAR: Negative for chest pain, and palpitations.   GI: Negative for abdominal discomfort, blood in stools or black stools.  No recent change in bowel habits.   GU:  No history of incontinence.   MUSCLOSKELETAL: No history of joint pain or swelling.  No myalgias.  SKIN: Negative for lesions, rash, and itching.   ENDOCRINE: Negative for cold or heat intolerance, polydipsia or goiter.   PSYCH:  No depression or anxiety symptoms.   NEURO: As Above.   Vital Signs:  BP 130/84   Pulse 74   Ht 6\' 1"  (1.854 m)   Wt 188 lb 2 oz (85.3 kg)   SpO2 97%   BMI 24.82 kg/m    General Medical Exam:   General:  Well appearing, comfortable  Eyes/ENT: see cranial nerve examination.   Neck: No masses appreciated.  Full range of motion without tenderness.  No carotid bruits. Respiratory:  Clear to auscultation, good air entry bilaterally.   Cardiac:  Regular rate and  rhythm, no murmur.   Ext:  No edema  Neurological Exam: MENTAL STATUS including orientation to time, place, person, recent and remote memory, attention span and concentration, language, and fund of knowledge is normal.  Speech is not dysarthric.  CRANIAL NERVES:  Pupils equal round and reactive to light.  Normal conjugate, extra-ocular eye movements in all directions of gaze.  No ptosis. Face is symmetric. Palate elevates symmetrically.  Tongue is midline.  MOTOR:  Motor strength is 5/5 in all extremities.  COORDINATION/GAIT: Gait narrow based and stable.   Data: CT head 08/07/2016:  1. Negative CT of the head. No acute or focal lesion to explain the patient's acute symptoms. 2. ASPECTS is 10/10  These results were called by telephone at the time of interpretation on 08/07/2016 at 9:55 Am to Dr. Benjiman Core , who verbally acknowledged these results.  MRI brain wo contrast 08/07/2016:  Negative MRI of the head   IMPRESSION/PLAN: 1.  Chronic migraine without aura, occurring 1-2 times per month  -Continue Trokendi 25 mg daily - refills provided.    2.  Episodic migraine without aura, improved with Imitrex 25mg  at headache onset - refills provided.   Return to clinic in 1 year   Thank you for allowing me to participate in patient's care.  If I can answer any additional questions, I would be pleased to do so.    Sincerely,    Essa Malachi K. Allena Katz, DO

## 2018-03-18 ENCOUNTER — Encounter: Payer: Self-pay | Admitting: Neurology

## 2018-03-18 ENCOUNTER — Ambulatory Visit: Payer: 59 | Admitting: Neurology

## 2018-03-18 VITALS — BP 130/84 | HR 74 | Ht 73.0 in | Wt 188.1 lb

## 2018-03-18 DIAGNOSIS — R51 Headache: Secondary | ICD-10-CM | POA: Diagnosis not present

## 2018-03-18 DIAGNOSIS — R519 Headache, unspecified: Secondary | ICD-10-CM

## 2018-03-18 DIAGNOSIS — G43109 Migraine with aura, not intractable, without status migrainosus: Secondary | ICD-10-CM | POA: Diagnosis not present

## 2018-03-18 MED ORDER — TROKENDI XR 25 MG PO CP24: 25.0000 mg | ORAL_CAPSULE | Freq: Every day | ORAL | 11 refills | Status: DC

## 2018-03-18 MED ORDER — SUMATRIPTAN SUCCINATE 25 MG PO TABS: ORAL_TABLET | ORAL | 3 refills | Status: DC

## 2018-03-18 NOTE — Patient Instructions (Signed)
Great to see you today!    Continue your medications as you are taking  Return to clinic in 1 year 

## 2018-03-19 ENCOUNTER — Ambulatory Visit (INDEPENDENT_AMBULATORY_CARE_PROVIDER_SITE_OTHER): Payer: 59 | Admitting: *Deleted

## 2018-03-19 DIAGNOSIS — J309 Allergic rhinitis, unspecified: Secondary | ICD-10-CM

## 2018-03-20 DIAGNOSIS — J301 Allergic rhinitis due to pollen: Secondary | ICD-10-CM

## 2018-03-20 NOTE — Progress Notes (Signed)
VIALS EXP 03-21-2019 

## 2018-03-26 ENCOUNTER — Ambulatory Visit (INDEPENDENT_AMBULATORY_CARE_PROVIDER_SITE_OTHER): Payer: 59 | Admitting: *Deleted

## 2018-03-26 DIAGNOSIS — J309 Allergic rhinitis, unspecified: Secondary | ICD-10-CM | POA: Diagnosis not present

## 2018-04-01 ENCOUNTER — Ambulatory Visit (INDEPENDENT_AMBULATORY_CARE_PROVIDER_SITE_OTHER): Payer: 59

## 2018-04-01 DIAGNOSIS — J309 Allergic rhinitis, unspecified: Secondary | ICD-10-CM | POA: Diagnosis not present

## 2018-04-15 ENCOUNTER — Other Ambulatory Visit: Payer: Self-pay

## 2018-04-15 ENCOUNTER — Ambulatory Visit (INDEPENDENT_AMBULATORY_CARE_PROVIDER_SITE_OTHER): Payer: 59

## 2018-04-15 DIAGNOSIS — J309 Allergic rhinitis, unspecified: Secondary | ICD-10-CM | POA: Diagnosis not present

## 2018-04-22 ENCOUNTER — Ambulatory Visit (INDEPENDENT_AMBULATORY_CARE_PROVIDER_SITE_OTHER): Payer: 59 | Admitting: *Deleted

## 2018-04-22 DIAGNOSIS — J309 Allergic rhinitis, unspecified: Secondary | ICD-10-CM

## 2018-04-30 ENCOUNTER — Ambulatory Visit (INDEPENDENT_AMBULATORY_CARE_PROVIDER_SITE_OTHER): Payer: 59 | Admitting: *Deleted

## 2018-04-30 DIAGNOSIS — J309 Allergic rhinitis, unspecified: Secondary | ICD-10-CM | POA: Diagnosis not present

## 2018-05-07 ENCOUNTER — Other Ambulatory Visit: Payer: Self-pay | Admitting: Neurology

## 2018-05-07 ENCOUNTER — Ambulatory Visit (INDEPENDENT_AMBULATORY_CARE_PROVIDER_SITE_OTHER): Payer: 59 | Admitting: *Deleted

## 2018-05-07 DIAGNOSIS — J309 Allergic rhinitis, unspecified: Secondary | ICD-10-CM

## 2018-05-14 ENCOUNTER — Ambulatory Visit (INDEPENDENT_AMBULATORY_CARE_PROVIDER_SITE_OTHER): Payer: 59

## 2018-05-14 DIAGNOSIS — J309 Allergic rhinitis, unspecified: Secondary | ICD-10-CM | POA: Diagnosis not present

## 2018-05-20 ENCOUNTER — Ambulatory Visit (INDEPENDENT_AMBULATORY_CARE_PROVIDER_SITE_OTHER): Payer: 59

## 2018-05-20 DIAGNOSIS — J309 Allergic rhinitis, unspecified: Secondary | ICD-10-CM

## 2018-05-27 ENCOUNTER — Ambulatory Visit (INDEPENDENT_AMBULATORY_CARE_PROVIDER_SITE_OTHER): Payer: 59 | Admitting: *Deleted

## 2018-05-27 DIAGNOSIS — J309 Allergic rhinitis, unspecified: Secondary | ICD-10-CM

## 2018-06-10 ENCOUNTER — Ambulatory Visit (INDEPENDENT_AMBULATORY_CARE_PROVIDER_SITE_OTHER): Payer: 59

## 2018-06-10 DIAGNOSIS — J309 Allergic rhinitis, unspecified: Secondary | ICD-10-CM | POA: Diagnosis not present

## 2018-06-12 NOTE — Progress Notes (Signed)
VIALS EXP 06-12-2019 

## 2018-06-13 DIAGNOSIS — J301 Allergic rhinitis due to pollen: Secondary | ICD-10-CM | POA: Diagnosis not present

## 2018-06-19 ENCOUNTER — Ambulatory Visit (INDEPENDENT_AMBULATORY_CARE_PROVIDER_SITE_OTHER): Payer: 59 | Admitting: *Deleted

## 2018-06-19 DIAGNOSIS — J309 Allergic rhinitis, unspecified: Secondary | ICD-10-CM | POA: Diagnosis not present

## 2018-06-26 ENCOUNTER — Ambulatory Visit (INDEPENDENT_AMBULATORY_CARE_PROVIDER_SITE_OTHER): Payer: 59 | Admitting: *Deleted

## 2018-06-26 DIAGNOSIS — J309 Allergic rhinitis, unspecified: Secondary | ICD-10-CM | POA: Diagnosis not present

## 2018-07-01 ENCOUNTER — Ambulatory Visit (INDEPENDENT_AMBULATORY_CARE_PROVIDER_SITE_OTHER): Payer: 59

## 2018-07-01 DIAGNOSIS — J309 Allergic rhinitis, unspecified: Secondary | ICD-10-CM

## 2018-07-08 ENCOUNTER — Ambulatory Visit: Payer: 59 | Admitting: Family Medicine

## 2018-07-08 ENCOUNTER — Other Ambulatory Visit: Payer: Self-pay

## 2018-07-08 ENCOUNTER — Ambulatory Visit (INDEPENDENT_AMBULATORY_CARE_PROVIDER_SITE_OTHER): Payer: 59

## 2018-07-08 ENCOUNTER — Telehealth: Payer: Self-pay | Admitting: Family Medicine

## 2018-07-08 ENCOUNTER — Encounter: Payer: Self-pay | Admitting: Family Medicine

## 2018-07-08 VITALS — BP 135/78 | HR 49 | Temp 97.6°F | Ht 73.0 in | Wt 170.0 lb

## 2018-07-08 DIAGNOSIS — I10 Essential (primary) hypertension: Secondary | ICD-10-CM | POA: Diagnosis not present

## 2018-07-08 DIAGNOSIS — R9431 Abnormal electrocardiogram [ECG] [EKG]: Secondary | ICD-10-CM

## 2018-07-08 DIAGNOSIS — J309 Allergic rhinitis, unspecified: Secondary | ICD-10-CM | POA: Diagnosis not present

## 2018-07-08 DIAGNOSIS — R001 Bradycardia, unspecified: Secondary | ICD-10-CM | POA: Diagnosis not present

## 2018-07-08 DIAGNOSIS — F988 Other specified behavioral and emotional disorders with onset usually occurring in childhood and adolescence: Secondary | ICD-10-CM | POA: Diagnosis not present

## 2018-07-08 NOTE — Progress Notes (Signed)
6/8/20204:40 PM  Joshua Wise Sep 30, 1990, 28 y.o., male [id number]  Chief Complaint  Patient presents with  . Medication Reaction    stopped taking the hygroton 2 wks ago due to having tingling tin the finger, dizziness and dehydration. Says he monitors bp at home and notices his heart rate has been low  . other    wanting to get on a medication that will help him focus during the day. Took a medication 14yrs ago, thinks it may have adderall    HPI:   Patient is a 28 y.o. male with past medical history significant for HTN who presents today for concerns of hypotension and ADD  Last OV may 2019  2 weeks had stopped to chlorthalidone He was always thirsty, numbness/tingling fingers, dizziness, nauseous, all sx resolved once stopped medication Recently quit dip, has cut back down etoh Monitors BP at home Exercises regularly No fhx early MI  Used to be on ADD meds as a child Stopped medications when he went to college However for past several months he has been struggling with ability to maintain focus, concentrate Reports that his ability to maintain on task if being negatively affected Denies any fidgetiness or other signs of hyperactivity  He reports routine HR 50s When he runs it goes up 170s He denies any dizziness, lightheadedness, SOB, palpitations He does report fatigue and constant achy legs and cold feet  BP Readings from Last 3 Encounters:  07/08/18 135/78  03/18/18 130/84  10/11/17 120/88   Wt Readings from Last 3 Encounters:  07/08/18 170 lb (77.1 kg)  03/18/18 188 lb 2 oz (85.3 kg)  09/14/17 185 lb (83.9 kg)    Fall Risk  07/08/2018 03/18/2018 09/14/2017 06/07/2017 02/14/2017  Falls in the past year? 0 0 No No No  Number falls in past yr: 0 0 - - -  Injury with Fall? 0 0 - - -  Follow up - Falls evaluation completed - - -     Depression screen [facility] 2/9 07/08/2018 06/07/2017 02/02/2017  Decreased Interest 0 0 0  Down, Depressed, Hopeless 0 0 0  PHQ - 2 Score  0 0 0    No Known Allergies  Prior to Admission medications   Medication Sig Start Date End Date Taking? Authorizing Provider  olopatadine (PATANOL) 0.1 % ophthalmic solution Place 1 drop into both eyes 2 (two) times daily as needed for allergies. 10/12/17  Yes Padgett, Rae Halsted, MD  SUMAtriptan (IMITREX) 25 MG tablet Take 1 tablet at headache onset.  May repeat in 2 hours if headache persists or recurs. 03/18/18  Yes Patel, Donika K, DO  TROKENDI XR 25 MG CP24 TAKE 1 CAPSULE (25 MG) BY MOUTH DAILY. 05/07/18  Yes Patel, Donika K, DO  chlorthalidone (HYGROTON) 25 MG tablet Take 12.5 mg by mouth daily.    [provider]    Past Medical History:  Diagnosis Date  . Eczema   . Hypertension     Past Surgical History:  Procedure Laterality Date  . None      Social History   Tobacco Use  . Smoking status: Never Smoker  . Smokeless tobacco: Never Used  Substance Use Topics  . Alcohol use: No    Comment: occasional, 1-2 nights per week (4-6 wine/beer)     Family History  Problem Relation Age of Onset  . Healthy Mother   . Hypertension Father   . Migraines Father   . Cancer Paternal Grandmother  melanoma and breast cancer  . Cancer Paternal Grandfather        bladder and colon cancer    ROS Per hpi  OBJECTIVE:  Today's Vitals   07/08/18 1615  BP: 135/78  Pulse: (!) 49  Temp: 97.6 F (36.4 C)  TempSrc: Oral  SpO2: 99%  Weight: 170 lb (77.1 kg)  Height: 6\' 1"  (1.854 m)   Body mass index is 22.43 kg/m.  Pulse Readings from Last 3 Encounters:  07/08/18 (!) 49  03/18/18 74  10/11/17 64    Physical Exam Vitals signs and nursing note reviewed.  Constitutional:      Appearance: He is well-developed.  HENT:     Head: Normocephalic and atraumatic.  Eyes:     Conjunctiva/sclera: Conjunctivae normal.     Pupils: Pupils are equal, round, and reactive to light.  Neck:     Musculoskeletal: Neck supple.  Cardiovascular:     Rate and Rhythm:  Regular rhythm. Bradycardia present.     Heart sounds: No murmur. No friction rub. No gallop.   Pulmonary:     Effort: Pulmonary effort is normal.     Breath sounds: Normal breath sounds. No wheezing, rhonchi or rales.  Skin:    General: Skin is warm and dry.  Neurological:     Mental Status: He is alert and oriented to person, place, and time.    My interpretation of EKG:  NSR, HR 48, persistent LVH criteria meet compared to 2018  ASSESSMENT and PLAN  1. Essential hypertension Patient cont to work on LFM, recent BP < 140/90 off medications, he would rather cont working in LFM and home BP monitoring at this time. RTC precautions reviewed. - TSH; Future - Comprehensive metabolic panel; Future - Lipid panel; Future - CBC; Future  2. Attention deficit disorder (ADD) without hyperactivity - Ambulatory referral to Psychiatry  3. Bradycardia On review of previous vitals, HR < 60 only once (57) with reports of fatigue and possible abnormal ekg. Referring to cards for eval and treatment. Labs pending. - EKG 12-Lead - TSH; Future - Comprehensive metabolic panel; Future - Lipid panel; Future - CBC; Future - Ambulatory referral to Cardiology  4. Abnormal EKG - Ambulatory referral to Cardiology  Return in about 3 months (around 10/08/2018).    Myles LippsIrma M Santiago, MD [facility] [address] [place], [place] [address] Ph.  [phone] Fax [phone]

## 2018-07-08 NOTE — Patient Instructions (Addendum)
  [place] [facility] Address: [address], [place], [place] [address] Phone: [phone]   If you have lab work done today you will be contacted with your lab results within the next 2 weeks.  If you have not heard from [place] then please contact us. The fastest way to get your results is to register for My Chart.   IF you received an x-ray today, you will receive an invoice from [facility]. Please contact [facility] at [phone] with questions or concerns regarding your invoice.   IF you received labwork today, you will receive an invoice from [facility]. Please contact [facility] at [phone] with questions or concerns regarding your invoice.   Our billing staff will not be able to assist you with questions regarding bills from these companies.  You will be contacted with the lab results as soon as they are available. The fastest way to get your results is to activate your My Chart account. Instructions are located on the last page of this paperwork. If you have not heard from [place] regarding the results in 2 weeks, please contact this office.

## 2018-07-10 NOTE — Telephone Encounter (Signed)
appnt scheduled °

## 2018-07-15 ENCOUNTER — Ambulatory Visit (INDEPENDENT_AMBULATORY_CARE_PROVIDER_SITE_OTHER): Payer: 59 | Admitting: *Deleted

## 2018-07-15 DIAGNOSIS — J309 Allergic rhinitis, unspecified: Secondary | ICD-10-CM | POA: Diagnosis not present

## 2018-07-22 ENCOUNTER — Ambulatory Visit (INDEPENDENT_AMBULATORY_CARE_PROVIDER_SITE_OTHER): Payer: 59

## 2018-07-22 DIAGNOSIS — J309 Allergic rhinitis, unspecified: Secondary | ICD-10-CM | POA: Diagnosis not present

## 2018-07-29 ENCOUNTER — Ambulatory Visit (INDEPENDENT_AMBULATORY_CARE_PROVIDER_SITE_OTHER): Payer: 59 | Admitting: *Deleted

## 2018-07-29 DIAGNOSIS — J309 Allergic rhinitis, unspecified: Secondary | ICD-10-CM

## 2018-08-02 ENCOUNTER — Ambulatory Visit: Payer: 59 | Admitting: Family Medicine

## 2018-08-05 ENCOUNTER — Ambulatory Visit (INDEPENDENT_AMBULATORY_CARE_PROVIDER_SITE_OTHER): Payer: 59

## 2018-08-05 DIAGNOSIS — J309 Allergic rhinitis, unspecified: Secondary | ICD-10-CM

## 2018-08-12 ENCOUNTER — Ambulatory Visit (INDEPENDENT_AMBULATORY_CARE_PROVIDER_SITE_OTHER): Payer: 59

## 2018-08-12 DIAGNOSIS — J309 Allergic rhinitis, unspecified: Secondary | ICD-10-CM

## 2018-08-23 ENCOUNTER — Ambulatory Visit (INDEPENDENT_AMBULATORY_CARE_PROVIDER_SITE_OTHER): Payer: 59 | Admitting: *Deleted

## 2018-08-23 DIAGNOSIS — J309 Allergic rhinitis, unspecified: Secondary | ICD-10-CM

## 2018-09-02 ENCOUNTER — Ambulatory Visit (INDEPENDENT_AMBULATORY_CARE_PROVIDER_SITE_OTHER): Payer: 59 | Admitting: *Deleted

## 2018-09-02 DIAGNOSIS — J309 Allergic rhinitis, unspecified: Secondary | ICD-10-CM

## 2018-09-09 ENCOUNTER — Ambulatory Visit (INDEPENDENT_AMBULATORY_CARE_PROVIDER_SITE_OTHER): Payer: 59 | Admitting: *Deleted

## 2018-09-09 DIAGNOSIS — J309 Allergic rhinitis, unspecified: Secondary | ICD-10-CM

## 2018-09-19 ENCOUNTER — Ambulatory Visit (INDEPENDENT_AMBULATORY_CARE_PROVIDER_SITE_OTHER): Payer: 59 | Admitting: *Deleted

## 2018-09-19 DIAGNOSIS — J309 Allergic rhinitis, unspecified: Secondary | ICD-10-CM | POA: Diagnosis not present

## 2018-09-23 ENCOUNTER — Ambulatory Visit (INDEPENDENT_AMBULATORY_CARE_PROVIDER_SITE_OTHER): Payer: 59 | Admitting: *Deleted

## 2018-09-23 DIAGNOSIS — J309 Allergic rhinitis, unspecified: Secondary | ICD-10-CM | POA: Diagnosis not present

## 2018-09-30 ENCOUNTER — Ambulatory Visit (INDEPENDENT_AMBULATORY_CARE_PROVIDER_SITE_OTHER): Payer: 59

## 2018-09-30 DIAGNOSIS — J309 Allergic rhinitis, unspecified: Secondary | ICD-10-CM | POA: Diagnosis not present

## 2018-10-01 DIAGNOSIS — J301 Allergic rhinitis due to pollen: Secondary | ICD-10-CM

## 2018-10-01 NOTE — Progress Notes (Signed)
VIALS EXP 10-01-19 

## 2018-10-09 ENCOUNTER — Ambulatory Visit (INDEPENDENT_AMBULATORY_CARE_PROVIDER_SITE_OTHER): Payer: 59 | Admitting: *Deleted

## 2018-10-09 DIAGNOSIS — J309 Allergic rhinitis, unspecified: Secondary | ICD-10-CM

## 2018-10-10 ENCOUNTER — Ambulatory Visit: Payer: 59 | Admitting: Family Medicine

## 2018-10-10 ENCOUNTER — Encounter: Payer: Self-pay | Admitting: Family Medicine

## 2018-10-10 ENCOUNTER — Other Ambulatory Visit: Payer: Self-pay

## 2018-10-10 VITALS — BP 126/75 | HR 76 | Temp 98.7°F | Ht 73.0 in | Wt 172.0 lb

## 2018-10-10 DIAGNOSIS — F901 Attention-deficit hyperactivity disorder, predominantly hyperactive type: Secondary | ICD-10-CM | POA: Diagnosis not present

## 2018-10-10 DIAGNOSIS — I10 Essential (primary) hypertension: Secondary | ICD-10-CM

## 2018-10-10 DIAGNOSIS — Z5181 Encounter for therapeutic drug level monitoring: Secondary | ICD-10-CM | POA: Diagnosis not present

## 2018-10-10 DIAGNOSIS — M67431 Ganglion, right wrist: Secondary | ICD-10-CM

## 2018-10-10 DIAGNOSIS — G43709 Chronic migraine without aura, not intractable, without status migrainosus: Secondary | ICD-10-CM | POA: Diagnosis not present

## 2018-10-10 NOTE — Progress Notes (Signed)
9/10/20209:45 AM  Joshua Wise 05/02/1990, 28 y.o., male [id number]  Chief Complaint  Patient presents with  . Follow-up    review on medication ritalin    HPI:   Patient is a 28 y.o. male with past medical history significant for HTN, migraines and ADD who presents today for routine followup  Last OV June 2020 Referred to psych - went to [facility], started on ritalin LA 10mg , has noticed sign improvement, specially in conversations, hyperfocus, decreased movement, improved balance Has been off chlorthalidone since last OV Sees neuro for migraines - well controlled on topomax and imitrex Declines flu vaccine  Having issues with ganglion cyst on right arm Getting pain, cramps Does yoga and plays golf  [facility] 10/10/2018 07/08/2018 06/07/2017  Decreased Interest 0 0 0  Down, Depressed, Hopeless 0 0 0  PHQ - 2 Score 0 0 0    Fall Risk  10/10/2018 07/08/2018 03/18/2018 09/14/2017 06/07/2017  Falls in the past year? 0 0 0 No No  Number falls in past yr: 0 0 0 - -  Injury with Fall? 0 0 0 - -  Follow up - - Falls evaluation completed - -     No Known Allergies  Prior to Admission medications   Medication Sig Start Date End Date Taking? Authorizing Provider  chlorthalidone (HYGROTON) 25 MG tablet Take 12.5 mg by mouth daily.   Yes [provider]  methylphenidate (RITALIN LA) 10 MG 24 hr capsule TK 1 C PO D 09/25/18  Yes [provider]  olopatadine (PATANOL) 0.1 % ophthalmic solution Place 1 drop into both eyes 2 (two) times daily as needed for allergies. 10/12/17  Yes Padgett, Pilar GrammesShaylar Patricia, MD  SUMAtriptan (IMITREX) 25 MG tablet Take 1 tablet at headache onset.  May repeat in 2 hours if headache persists or recurs. 03/18/18  Yes Patel, Donika K, DO  TROKENDI XR 25 MG CP24 TAKE 1 CAPSULE (25 MG) BY MOUTH DAILY. 05/07/18  Yes [place]ale ChardPatel, Donika K, DO    Past Medical History:  Diagnosis Date  . Eczema   . Hypertension     Past  Surgical History:  Procedure Laterality Date  . None      Social History   Tobacco Use  . Smoking status: Never Smoker  . Smokeless tobacco: Never Used  Substance Use Topics  . Alcohol use: No    Comment: occasional, 1-2 nights per week (4-6 wine/beer)     Family History  Problem Relation Age of Onset  . Healthy Mother   . Hypertension Father   . Migraines Father   . Cancer Paternal Grandmother        melanoma and breast cancer  . Cancer Paternal Grandfather        bladder and colon cancer    Review of Systems  Constitutional: Negative for chills and fever.  Respiratory: Negative for cough and shortness of breath.   Cardiovascular: Negative for chest pain, palpitations and leg swelling.  Gastrointestinal: Negative for abdominal pain, nausea and vomiting.     OBJECTIVE:  Today's Vitals   10/10/18 0921  BP: 126/75  Pulse: 76  Temp: 98.7 F (37.1 C)  TempSrc: Oral  SpO2: 98%  Weight: 172 lb (78 kg)  Height: 6\' 1"  (1.854 m)   Body mass index is 22.69 kg/m.  Wt Readings from Last 3 Encounters:  10/10/18 172 lb (78 kg)  07/08/18 170 lb (77.1 kg)  03/18/18 188 lb 2 oz (85.3 kg)  Physical Exam Vitals signs and nursing note reviewed.  Constitutional:      Appearance: He is well-developed.  HENT:     Head: Normocephalic and atraumatic.  Eyes:     Conjunctiva/sclera: Conjunctivae normal.     Pupils: Pupils are equal, round, and reactive to light.  Neck:     Musculoskeletal: Neck supple.  Pulmonary:     Effort: Pulmonary effort is normal.  Skin:    General: Skin is warm and dry.  Neurological:     Mental Status: He is alert and oriented to person, place, and time.     No results found for this or any previous visit (from the past 24 hour(s)).  No results found.   ASSESSMENT and PLAN  1. Medication monitoring encounter - Basic Metabolic Panel  2. Essential hypertension, benign Controlled with LFM  3. Chronic migraine without aura without  status migrainosus, not intractable Controlled. Managed by neuro  4. Attention deficit hyperactivity disorder (ADHD), predominantly hyperactive type Stable. Managed by psych  5. Ganglion cyst of volar aspect of right wrist - Ambulatory referral to Hand Surgery  Other orders   Return in about 1 year (around 10/10/2019) for CPE.    Rutherford Guys, MD [facility] at [place], [place] [address] Ph.  [phone] Fax [phone]

## 2018-10-10 NOTE — Patient Instructions (Signed)
° ° ° °  If you have lab work done today you will be contacted with your lab results within the next 2 weeks.  If you have not heard from us then please contact us. The fastest way to get your results is to register for My Chart. ° ° °IF you received an x-ray today, you will receive an invoice from [facility]. Please contact [facility] at [phone] with questions or concerns regarding your invoice.  ° °IF you received labwork today, you will receive an invoice from [facility]. Please contact [facility] at [phone] with questions or concerns regarding your invoice.  ° °Our billing staff will not be able to assist you with questions regarding bills from these companies. ° °You will be contacted with the lab results as soon as they are available. The fastest way to get your results is to activate your My Chart account. Instructions are located on the last page of this paperwork. If you have not heard from us regarding the results in 2 weeks, please contact this office. °  ° ° ° °

## 2018-10-11 LAB — BASIC METABOLIC PANEL
BUN/Creatinine Ratio: 13 (ref 9–20)
BUN: 14 mg/dL (ref 6–20)
CO2: 21 mmol/L (ref 20–29)
Calcium: 10.8 mg/dL — ABNORMAL HIGH (ref 8.7–10.2)
Chloride: 106 mmol/L (ref 96–106)
Creatinine, Ser: 1.11 mg/dL (ref 0.76–1.27)
GFR calc Af Amer: 104 mL/min/{1.73_m2} (ref >59)
GFR calc non Af Amer: 90 mL/min/{1.73_m2} (ref >59)
Glucose: 91 mg/dL (ref 65–99)
Potassium: 4.4 mmol/L (ref 3.5–5.2)
Sodium: 140 mmol/L (ref 134–144)

## 2018-10-14 ENCOUNTER — Ambulatory Visit (INDEPENDENT_AMBULATORY_CARE_PROVIDER_SITE_OTHER): Payer: 59

## 2018-10-14 DIAGNOSIS — J309 Allergic rhinitis, unspecified: Secondary | ICD-10-CM

## 2018-10-24 ENCOUNTER — Ambulatory Visit (INDEPENDENT_AMBULATORY_CARE_PROVIDER_SITE_OTHER): Payer: 59

## 2018-10-24 DIAGNOSIS — J309 Allergic rhinitis, unspecified: Secondary | ICD-10-CM | POA: Diagnosis not present

## 2018-10-29 ENCOUNTER — Ambulatory Visit (INDEPENDENT_AMBULATORY_CARE_PROVIDER_SITE_OTHER): Payer: 59 | Admitting: *Deleted

## 2018-10-29 DIAGNOSIS — J309 Allergic rhinitis, unspecified: Secondary | ICD-10-CM | POA: Diagnosis not present

## 2018-11-08 ENCOUNTER — Encounter: Payer: Self-pay | Admitting: Neurology

## 2018-11-18 ENCOUNTER — Ambulatory Visit (INDEPENDENT_AMBULATORY_CARE_PROVIDER_SITE_OTHER): Payer: 59

## 2018-11-18 DIAGNOSIS — J309 Allergic rhinitis, unspecified: Secondary | ICD-10-CM | POA: Diagnosis not present

## 2018-11-29 ENCOUNTER — Ambulatory Visit (INDEPENDENT_AMBULATORY_CARE_PROVIDER_SITE_OTHER): Payer: 59

## 2018-11-29 DIAGNOSIS — J309 Allergic rhinitis, unspecified: Secondary | ICD-10-CM | POA: Diagnosis not present

## 2018-12-04 ENCOUNTER — Ambulatory Visit (INDEPENDENT_AMBULATORY_CARE_PROVIDER_SITE_OTHER): Payer: 59 | Admitting: *Deleted

## 2018-12-04 DIAGNOSIS — J309 Allergic rhinitis, unspecified: Secondary | ICD-10-CM

## 2018-12-09 ENCOUNTER — Ambulatory Visit (INDEPENDENT_AMBULATORY_CARE_PROVIDER_SITE_OTHER): Payer: 59

## 2018-12-09 DIAGNOSIS — J309 Allergic rhinitis, unspecified: Secondary | ICD-10-CM

## 2018-12-10 ENCOUNTER — Other Ambulatory Visit: Payer: Self-pay | Admitting: Neurology

## 2018-12-23 ENCOUNTER — Ambulatory Visit (INDEPENDENT_AMBULATORY_CARE_PROVIDER_SITE_OTHER): Payer: 59

## 2018-12-23 DIAGNOSIS — J309 Allergic rhinitis, unspecified: Secondary | ICD-10-CM | POA: Diagnosis not present

## 2019-01-06 ENCOUNTER — Ambulatory Visit (INDEPENDENT_AMBULATORY_CARE_PROVIDER_SITE_OTHER): Payer: 59

## 2019-01-06 DIAGNOSIS — J309 Allergic rhinitis, unspecified: Secondary | ICD-10-CM

## 2019-01-13 DIAGNOSIS — J301 Allergic rhinitis due to pollen: Secondary | ICD-10-CM | POA: Diagnosis not present

## 2019-01-13 NOTE — Progress Notes (Signed)
VIALS EXP 01-13-20 

## 2019-01-29 ENCOUNTER — Ambulatory Visit (INDEPENDENT_AMBULATORY_CARE_PROVIDER_SITE_OTHER): Payer: 59 | Admitting: *Deleted

## 2019-01-29 DIAGNOSIS — J309 Allergic rhinitis, unspecified: Secondary | ICD-10-CM

## 2019-02-10 ENCOUNTER — Ambulatory Visit (INDEPENDENT_AMBULATORY_CARE_PROVIDER_SITE_OTHER): Payer: 59

## 2019-02-10 DIAGNOSIS — J309 Allergic rhinitis, unspecified: Secondary | ICD-10-CM

## 2019-02-14 ENCOUNTER — Encounter: Payer: Self-pay | Admitting: Neurology

## 2019-02-24 ENCOUNTER — Ambulatory Visit (INDEPENDENT_AMBULATORY_CARE_PROVIDER_SITE_OTHER): Payer: 59

## 2019-02-24 DIAGNOSIS — J309 Allergic rhinitis, unspecified: Secondary | ICD-10-CM | POA: Diagnosis not present

## 2019-03-05 ENCOUNTER — Ambulatory Visit: Payer: 59 | Admitting: Allergy

## 2019-03-12 ENCOUNTER — Ambulatory Visit (INDEPENDENT_AMBULATORY_CARE_PROVIDER_SITE_OTHER): Payer: 59

## 2019-03-12 DIAGNOSIS — J309 Allergic rhinitis, unspecified: Secondary | ICD-10-CM | POA: Diagnosis not present

## 2019-03-19 ENCOUNTER — Encounter: Payer: Self-pay | Admitting: Neurology

## 2019-03-19 ENCOUNTER — Ambulatory Visit: Payer: 59 | Admitting: Neurology

## 2019-03-20 ENCOUNTER — Encounter: Payer: Self-pay | Admitting: Neurology

## 2019-03-20 ENCOUNTER — Ambulatory Visit: Payer: 59 | Admitting: Neurology

## 2019-03-20 ENCOUNTER — Telehealth (INDEPENDENT_AMBULATORY_CARE_PROVIDER_SITE_OTHER): Payer: 59 | Admitting: Neurology

## 2019-03-20 ENCOUNTER — Other Ambulatory Visit: Payer: Self-pay

## 2019-03-20 DIAGNOSIS — G43109 Migraine with aura, not intractable, without status migrainosus: Secondary | ICD-10-CM

## 2019-03-20 DIAGNOSIS — G43709 Chronic migraine without aura, not intractable, without status migrainosus: Secondary | ICD-10-CM | POA: Diagnosis not present

## 2019-03-20 DIAGNOSIS — R519 Headache, unspecified: Secondary | ICD-10-CM

## 2019-03-20 MED ORDER — SUMATRIPTAN SUCCINATE 25 MG PO TABS: ORAL_TABLET | ORAL | 3 refills | Status: DC

## 2019-03-20 MED ORDER — TROKENDI XR 25 MG PO CP24: 25.0000 mg | ORAL_CAPSULE | Freq: Every day | ORAL | 11 refills | Status: DC

## 2019-03-20 NOTE — Progress Notes (Signed)
   Virtual Visit via Video Note The purpose of this virtual visit is to provide medical care while limiting exposure to the novel coronavirus.    Consent was obtained for video visit:  Yes.   Answered questions that patient had about telehealth interaction:  Yes.   I discussed the limitations, risks, security and privacy concerns of performing an evaluation and management service by telemedicine. I also discussed with the patient that there may be a patient responsible charge related to this service. The patient expressed understanding and agreed to proceed.  Pt location: Home Physician Location: office Name of referring provider:  Myles Lipps, MD I connected with Nicolette Bang at patients initiation/request on 03/20/2019 at  3:30 PM EST by video enabled telemedicine application and verified that I am speaking with the correct person using two identifiers. Pt MRN:  [id number] Pt DOB:  Feb 14, 1990 Video Participants:  Nicolette Bang   History of Present Illness: This is a 29 y.o. male returning for follow-up of migraines.  He has been doing very well over the past year and reports that migraine frequency has improved to once every 2 months.  He may get 2-3 migraines during this time, but the intensity is improved.  He takes imitrex which helps.  He has good medication adherence and tolerability.   Observations/Objective:   Patient is awake, alert, and appears comfortable.  Oriented x 4.   Face is symmetric.  Speech is not dysarthric.  Antigravity in all extremities.   Assessment and Plan:  1.  Chronic migraine, previously occurring 1-2 times per week and since starting Trokendi frequency markedly improved  - Continue Trokendi 25mg  daily - refills provided for 1 year  - His migraines are very stable on medication, and at some point we may consider monitoring off medication, if desired.   For now, continue as mentioned above.  2. Episodic migraine without aura  - Continue Imitrex  25mg  at headache onset - refills provided   Follow Up Instructions:   I discussed the assessment and treatment plan with the patient. The patient was provided an opportunity to ask questions and all were answered. The patient agreed with the plan and demonstrated an understanding of the instructions.   The patient was advised to call back or seek an in-person evaluation if the symptoms worsen or if the condition fails to improve as anticipated.  Follow-up in 1 year, or sooner as needed   , DO

## 2019-03-24 ENCOUNTER — Encounter: Payer: Self-pay | Admitting: *Deleted

## 2019-03-24 NOTE — Progress Notes (Addendum)
Received a fax requesting more information and Dr. Eliane Decree signature.  Faxed back form filled out and with last visit and phone calls  Received Fax from [facility] this was administratively denied as a non covered drug. Sent message to Dr. Allena Katz to see if she wants to appeal because he was already taking this med.

## 2019-03-27 ENCOUNTER — Encounter: Payer: Self-pay | Admitting: Allergy

## 2019-03-27 ENCOUNTER — Other Ambulatory Visit: Payer: Self-pay

## 2019-03-27 ENCOUNTER — Ambulatory Visit (INDEPENDENT_AMBULATORY_CARE_PROVIDER_SITE_OTHER): Payer: 59 | Admitting: Allergy

## 2019-03-27 DIAGNOSIS — J3089 Other allergic rhinitis: Secondary | ICD-10-CM

## 2019-03-27 DIAGNOSIS — H1013 Acute atopic conjunctivitis, bilateral: Secondary | ICD-10-CM

## 2019-03-27 MED ORDER — EPINEPHRINE 0.3 MG/0.3ML IJ SOAJ: 0.3000 mg | Freq: Once | INTRAMUSCULAR | 1 refills | Status: AC

## 2019-03-27 NOTE — Patient Instructions (Signed)
Allergic rhinitis with conjunctivitis    - continue avoidance measures for grasses, weeds, trees, dust mites, molds, cockroach    - continue allergen immunotherapy per protocol.  He is at maintenance dosing at this time!  He will continue to have access to his epinephrine device and will refill today.    - discussed trying Xyzal as needed at this time.  If he has increase in allergy symptoms then he can resume daily dosing.  Advised that he should continue to use his Xyzal prior to his allergy injections if he is taking it as needed.    - for nasal congestion use Flonase 2 sprays each nostril daily as needed. Use for 1-2 weeks at time before stopping once symptoms improve    - for itchy/watery/red eyes use over-the-counter Pataday 1 drop each eye daily as needed    -continue nasal saline rinses daily as needed    Follow-up 12 months or sooner if needed

## 2019-03-27 NOTE — Progress Notes (Signed)
RE: Joshua Wise MRN: [id number] DOB: 12/18/1990 Date of Telemedicine Visit: 03/27/2019  Referring provider: Myles Lipps, MD Primary care provider: Myles Lipps, MD  Chief Complaint: Allergic Rhinitis    Telemedicine Follow Up Visit via Telephone: I connected with Joshua Wise for a follow up on 03/27/19 by telephone and verified that I am speaking with the correct person using two identifiers.   I discussed the limitations, risks, security and privacy concerns of performing an evaluation and management service by telephone and the availability of in person appointments. I also discussed with the patient that there may be a patient responsible charge related to this service. The patient expressed understanding and agreed to proceed.  Patient is at home.  Provider is at the office.  Visit start time: 1035 Visit end time: 1050 Insurance consent/check in by: Romeo Rabon Medical consent and medical assistant/nurse: Deshaunia R  History of Present Illness: He is a 29 y.o. male, who is being followed for allergic rhinitis with conjunctivitis. His previous allergy office visit was on 10/11/2017 with Dr. Delorse Lek.   He states he has been doing well without any significant health changes, surgeries or hospitalizations.  He is on immunotherapy and is at the maintenance vials coming every 2 weeks for his injections.  He is tolerating immunotherapy well without any large local or systemic reactions.  He does have access to epinephrine device but needs a refill today.  He states that he can tell a difference in starting on his immunotherapy with a decrease in allergy symptoms.  He still has some nasal congestion and also is reporting some nasal dryness.  He uses the Flonase as needed as well as the saline rinses.  He does continue on Xyzal daily.  He is wondering at this time if he needs to be on the Xyzal daily.  Assessment and Plan: Joshua Wise is a 29 y.o. male with:   Allergic rhinitis  with conjunctivitis    - continue avoidance measures for grasses, weeds, trees, dust mites, molds, cockroach    - continue allergen immunotherapy per protocol.  He is at maintenance dosing at this time!  He will continue to have access to his epinephrine device and will refill today.    - discussed trying Xyzal as needed at this time.  If he has increase in allergy symptoms then he can resume daily dosing.  Advised that he should continue to use his Xyzal prior to his allergy injections if he is taking it as needed.    - for nasal congestion use Flonase 2 sprays each nostril daily as needed. Use for 1-2 weeks at time before stopping once symptoms improve    - for itchy/watery/red eyes use over-the-counter Pataday 1 drop each eye daily as needed    -continue nasal saline rinses daily as needed    Follow-up 12 months or sooner if needed    Diagnostics: None.  Medication List:  Current Outpatient Medications  Medication Sig Dispense Refill  . fluticasone (FLONASE) 50 MCG/ACT nasal spray Place 2 sprays into both nostrils daily as needed for allergies or rhinitis.    Marland Kitchen guanFACINE (INTUNIV) 1 MG TB24 ER tablet Take 1 mg by mouth daily.    Marland Kitchen levocetirizine (XYZAL) 5 MG tablet Take 5 mg by mouth every evening.    . SUMAtriptan (IMITREX) 25 MG tablet Take 1 tablet at headache onset.  May repeat in 2 hours if headache persists or recurs. 10 tablet 3  . Topiramate ER (TROKENDI XR) 25  MG CP24 Take 1 capsule (25 mg total) by mouth daily. 30 capsule 11  . VYVANSE 20 MG capsule Take 20 mg by mouth every morning.     No current facility-administered medications for this visit.   Allergies: No Known Allergies I reviewed his past medical history, social history, family history, and environmental history and no significant changes have been reported from previous visit on 10/11/2017.  Review of Systems  Constitutional: Negative.   HENT: Positive for congestion.   Eyes: Negative.   Respiratory:  Negative.   Cardiovascular: Negative.   Gastrointestinal: Negative.   Musculoskeletal: Negative.   Skin: Negative.   Neurological: Negative.    Objective: Physical Exam Not obtained as encounter was done via telephone.   Previous notes and tests were reviewed.  I discussed the assessment and treatment plan with the patient. The patient was provided an opportunity to ask questions and all were answered. The patient agreed with the plan and demonstrated an understanding of the instructions.   The patient was advised to call back or seek an in-person evaluation if the symptoms worsen or if the condition fails to improve as anticipated.  I provided 15 minutes of non-face-to-face time during this encounter.  It was my pleasure to participate in [place] care today. Please feel free to contact me with any questions or concerns.   Sincerely,  Jacari Kirsten Charmian Muff, MD

## 2019-03-28 ENCOUNTER — Telehealth: Payer: Self-pay

## 2019-03-28 NOTE — Telephone Encounter (Signed)
Per patients insurance Trokendi was denied and Dr Allena Katz would like the patients OK to switch to generic toprimate 25 mg. Called to get the verbal, no answer Left message to call office back   Denial sent to scan

## 2019-03-28 NOTE — Telephone Encounter (Signed)
Sent mychart message

## 2019-03-31 ENCOUNTER — Encounter: Payer: Self-pay | Admitting: Neurology

## 2019-04-01 ENCOUNTER — Ambulatory Visit (INDEPENDENT_AMBULATORY_CARE_PROVIDER_SITE_OTHER): Payer: 59 | Admitting: *Deleted

## 2019-04-01 DIAGNOSIS — J309 Allergic rhinitis, unspecified: Secondary | ICD-10-CM | POA: Diagnosis not present

## 2019-04-07 ENCOUNTER — Ambulatory Visit (INDEPENDENT_AMBULATORY_CARE_PROVIDER_SITE_OTHER): Payer: 59

## 2019-04-07 DIAGNOSIS — J309 Allergic rhinitis, unspecified: Secondary | ICD-10-CM | POA: Diagnosis not present

## 2019-04-08 NOTE — Progress Notes (Signed)
Spoke with patient as Trokendi PA was coming back denied for [facility] thru CMM. Patient said that he was still able to pick up medication from old pharm [facility] and just recently got a refill. I instructed patient to continue to pick up medication there since PA is valid and on file. Otherwise if he wanted to transfer to [facility] he would need to call [facility] and have them transfer PA there. Patient did not want to switch to generic topiramate medication- said it didn't help him. Wants to stay with Trokendi.

## 2019-04-17 ENCOUNTER — Ambulatory Visit (INDEPENDENT_AMBULATORY_CARE_PROVIDER_SITE_OTHER): Payer: 59 | Admitting: *Deleted

## 2019-04-17 DIAGNOSIS — J309 Allergic rhinitis, unspecified: Secondary | ICD-10-CM

## 2019-04-17 IMAGING — MR MR HEAD W/O CM
9 of 10 series · 36 of 48 positions shown · non-contrast
Comparison: CT 08/07/2016

CLINICAL DATA: Right facial numbness.  Headache

EXAM:
MRI HEAD WITHOUT CONTRAST
TECHNIQUE: Multiplanar, multiecho pulse sequences of the brain and surrounding
structures were obtained without intravenous contrast.

[Series 3: DWI · axial · 3.0mm · 0.94mm/px · z∈[-60,+74]mm · 9 of 94 slices shown (1 of 2)]
[im 1/94]
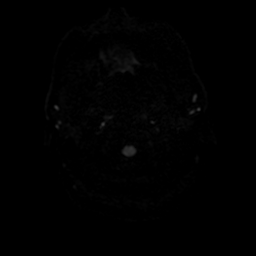
[im 12/94]
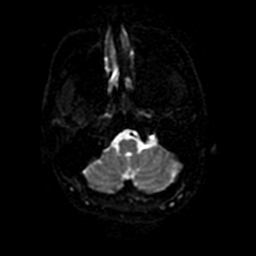
[im 24/94]
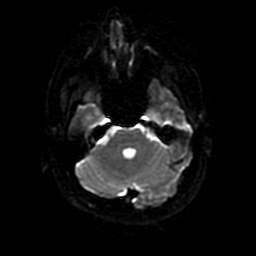
[im 35/94]
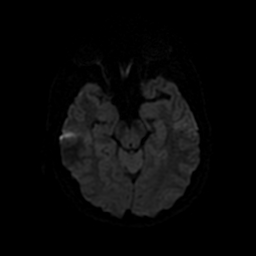
[im 47/94]
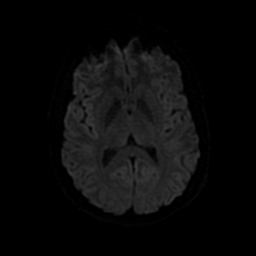
[im 59/94]
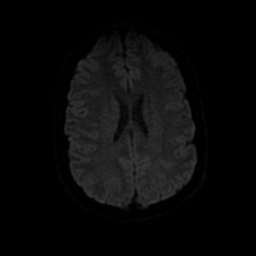
[im 70/94]
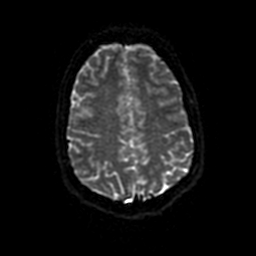
[im 82/94]
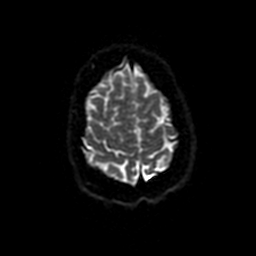
[im 94/94]
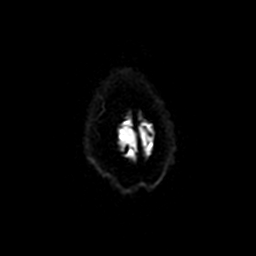

[Series 4: DWI · coronal · 4.0mm · 0.94mm/px · 7 of 66 slices shown (2 of 2)]
[im 1/66]
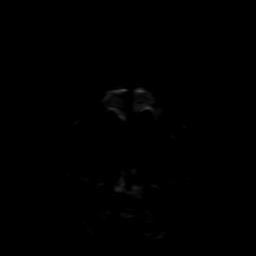
[im 11/66]
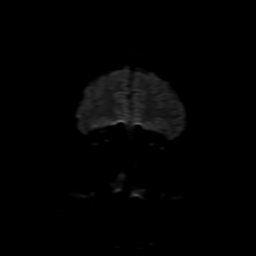
[im 22/66]
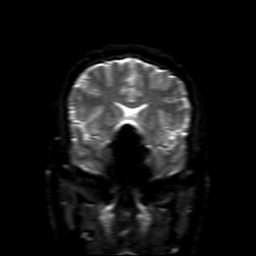
[im 33/66]
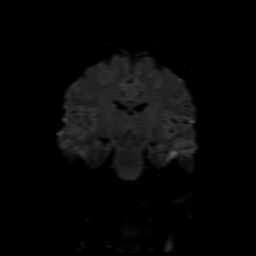
[im 44/66]
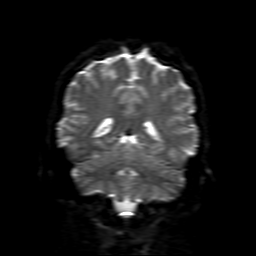
[im 55/66]
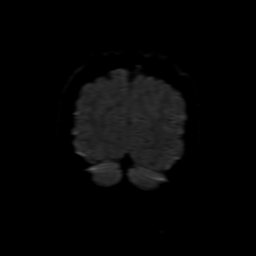
[im 66/66]
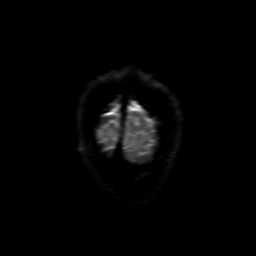

[Series 5: T2 · axial · 5.0mm · 0.47mm/px · z∈[-60,+74]mm · 2 of 24 slices shown (1 of 2)]
[im 1/24]
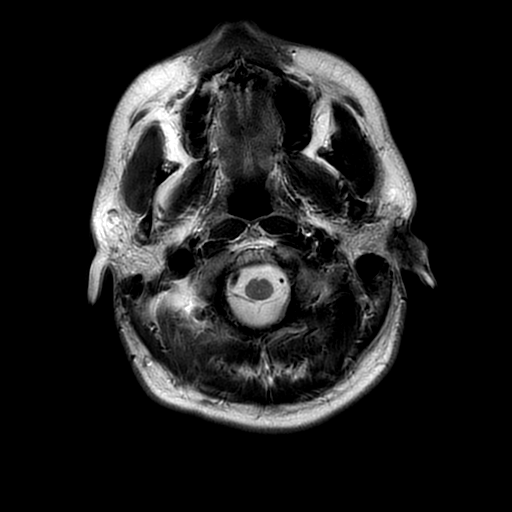
[im 24/24]
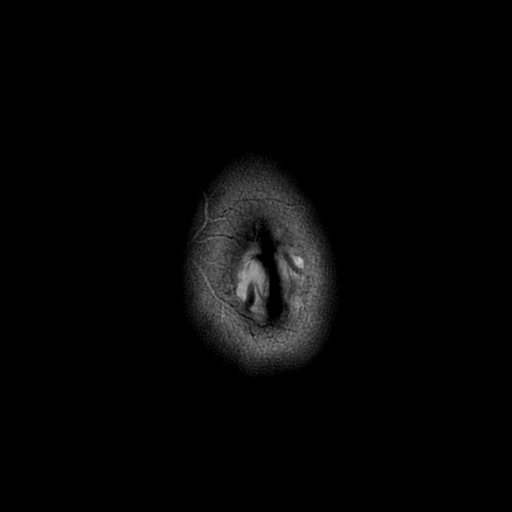

[Series 6: FLAIR · axial · 5.0mm · 0.47mm/px · z∈[-60,+74]mm · 2 of 24 slices shown (1 of 2)]
[im 1/24]
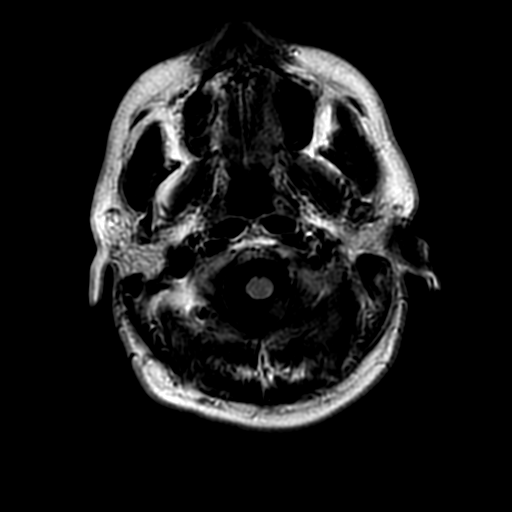
[im 24/24]
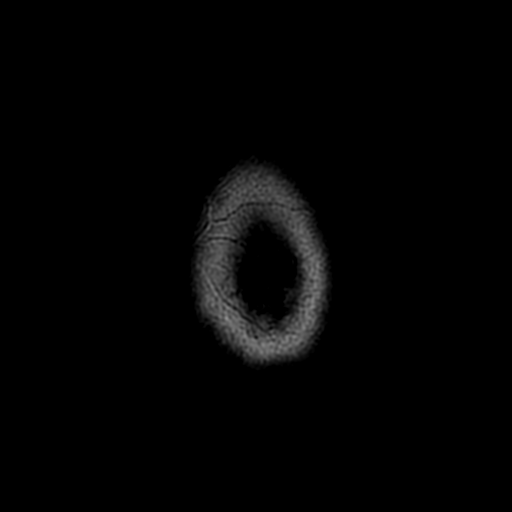

[Series 7: FLAIR · sagittal · 5.0mm · 0.47mm/px · 2 of 23 slices shown (2 of 2)]
[im 1/23]
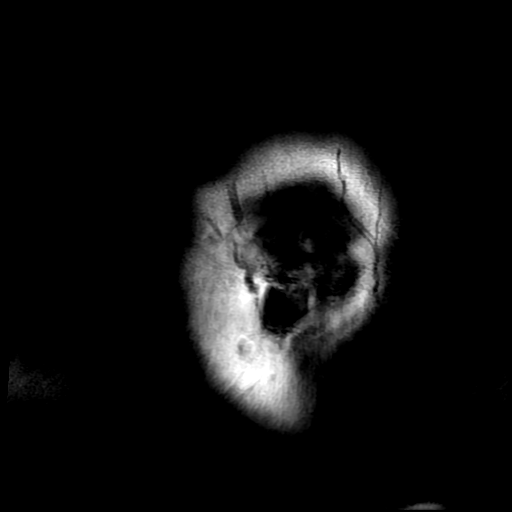
[im 23/23]
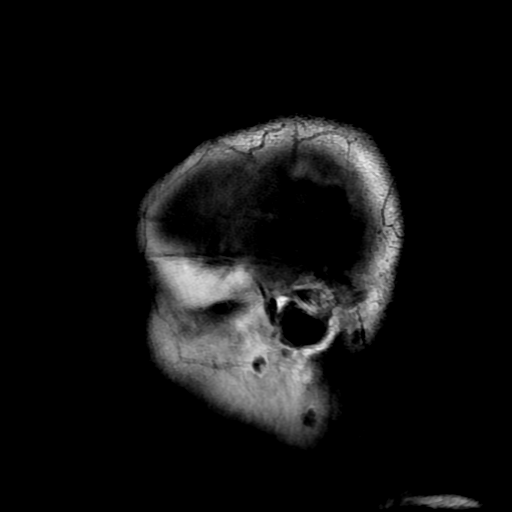

[Series 8: (person_name) · axial · 3.0mm · 0.47mm/px · z∈[-61,-16]mm · 3 of 96 slices shown]
[im 1/96]
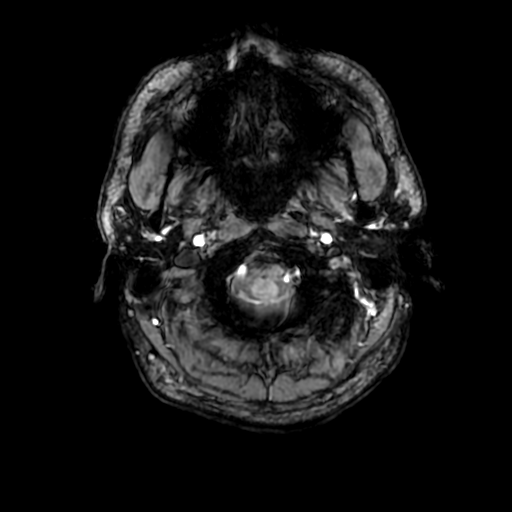
[im 11/96]
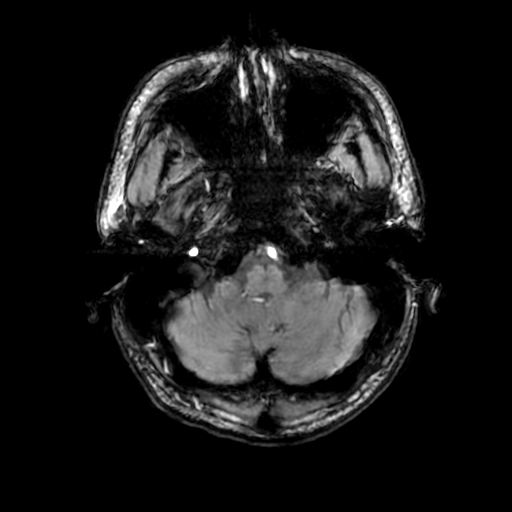
[im 32/96]
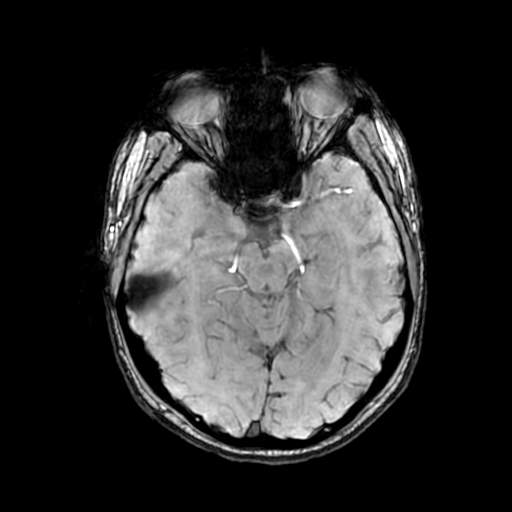

[Series 10: T2 · coronal · 5.0mm · 0.47mm/px · 3 of 28 slices shown (2 of 2)]
[im 1/28]
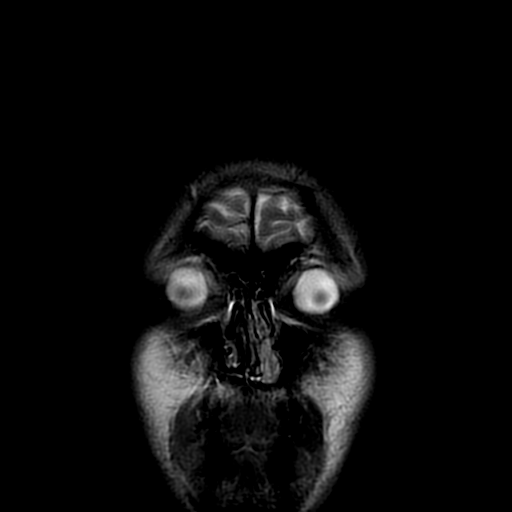
[im 14/28]
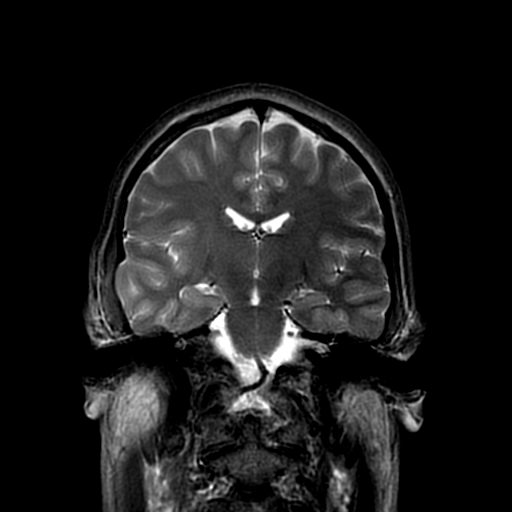
[im 28/28]
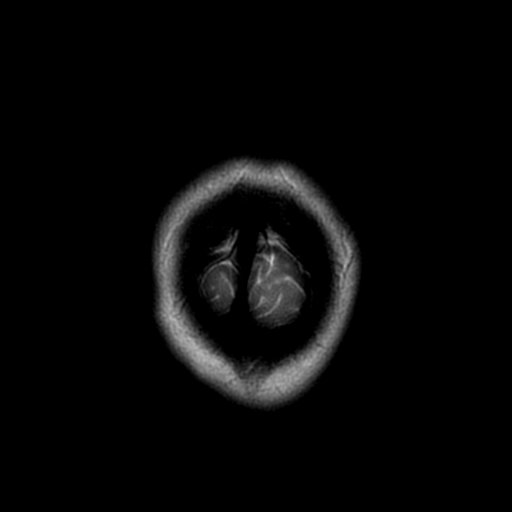

[Series 350: ADC · axial · 3.0mm · 0.94mm/px · z∈[-60,+74]mm · 5 of 47 slices shown (1 of 2)]
[im 1/47]
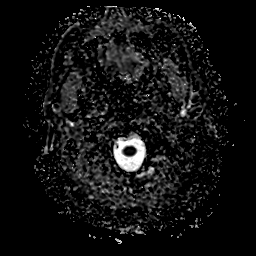
[im 12/47]
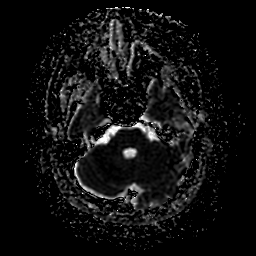
[im 24/47]
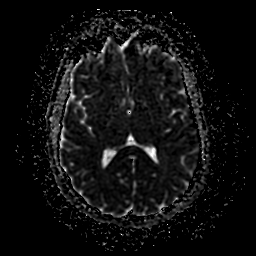
[im 35/47]
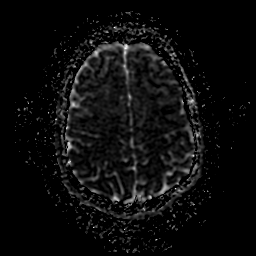
[im 47/47]
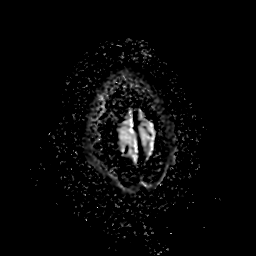

[Series 450: ADC · coronal · 4.0mm · 0.94mm/px · 3 of 33 slices shown (2 of 2)]
[im 1/33]
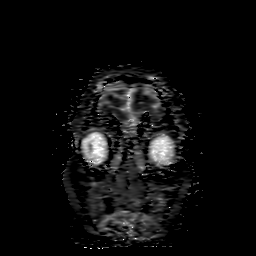
[im 17/33]
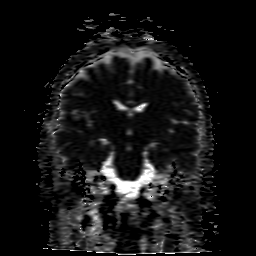
[im 33/33]
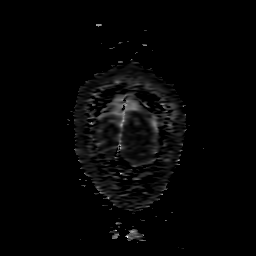

[36 of 48 positions shown; findings below may reference images not displayed]

FINDINGS: Brain: No acute infarction, hemorrhage, hydrocephalus, extra-axial
collection or mass lesion.

Vascular: Normal arterial flow voids.

Skull and upper cervical spine: Negative

Sinuses/Orbits: Negative

Other: None
IMPRESSION: Negative MRI of the head

## 2019-04-22 ENCOUNTER — Telehealth: Payer: Self-pay | Admitting: *Deleted

## 2019-04-22 NOTE — Telephone Encounter (Signed)
Received a fax from [facility] pharmacy stating that they have been trying to contact the patient to set up delivery for the patient's EpiPen. Called patient and left a detailed voicemail per DPR permission advising to call [phone] to set up delivery.

## 2019-04-25 ENCOUNTER — Ambulatory Visit (INDEPENDENT_AMBULATORY_CARE_PROVIDER_SITE_OTHER): Payer: 59 | Admitting: *Deleted

## 2019-04-25 DIAGNOSIS — J309 Allergic rhinitis, unspecified: Secondary | ICD-10-CM | POA: Diagnosis not present

## 2019-05-05 ENCOUNTER — Ambulatory Visit (INDEPENDENT_AMBULATORY_CARE_PROVIDER_SITE_OTHER): Payer: 59

## 2019-05-05 DIAGNOSIS — J309 Allergic rhinitis, unspecified: Secondary | ICD-10-CM | POA: Diagnosis not present

## 2019-05-20 ENCOUNTER — Ambulatory Visit (INDEPENDENT_AMBULATORY_CARE_PROVIDER_SITE_OTHER): Payer: 59

## 2019-05-20 DIAGNOSIS — J309 Allergic rhinitis, unspecified: Secondary | ICD-10-CM

## 2019-06-03 ENCOUNTER — Ambulatory Visit (INDEPENDENT_AMBULATORY_CARE_PROVIDER_SITE_OTHER): Payer: 59

## 2019-06-03 DIAGNOSIS — J309 Allergic rhinitis, unspecified: Secondary | ICD-10-CM | POA: Diagnosis not present

## 2019-06-19 ENCOUNTER — Ambulatory Visit (INDEPENDENT_AMBULATORY_CARE_PROVIDER_SITE_OTHER): Payer: 59

## 2019-06-19 DIAGNOSIS — J309 Allergic rhinitis, unspecified: Secondary | ICD-10-CM | POA: Diagnosis not present

## 2019-07-02 ENCOUNTER — Other Ambulatory Visit: Payer: Self-pay

## 2019-07-02 MED ORDER — TROKENDI XR 25 MG PO CP24: 25.0000 mg | ORAL_CAPSULE | Freq: Every day | ORAL | 11 refills | Status: DC

## 2019-07-03 ENCOUNTER — Ambulatory Visit (INDEPENDENT_AMBULATORY_CARE_PROVIDER_SITE_OTHER): Payer: 59

## 2019-07-03 DIAGNOSIS — J309 Allergic rhinitis, unspecified: Secondary | ICD-10-CM

## 2019-07-10 DIAGNOSIS — J301 Allergic rhinitis due to pollen: Secondary | ICD-10-CM | POA: Diagnosis not present

## 2019-07-10 NOTE — Progress Notes (Signed)
Exp 07/09/20 

## 2019-07-22 ENCOUNTER — Ambulatory Visit (INDEPENDENT_AMBULATORY_CARE_PROVIDER_SITE_OTHER): Payer: 59

## 2019-07-22 DIAGNOSIS — J309 Allergic rhinitis, unspecified: Secondary | ICD-10-CM

## 2019-07-23 ENCOUNTER — Telehealth: Payer: Self-pay | Admitting: Family Medicine

## 2019-07-23 NOTE — Telephone Encounter (Signed)
Returned pts call per CRM/ LVM FOR HIM TO CALL BACK TO SCHEDULE APPT. NO AVAILABILITY FOR TODAY.MAYBE URGENT CARE IF NEEDED TODAY OR CALL BACK AND WE WILL TRY TO SCHEDULE FOR ANOTHER DAY

## 2019-08-01 ENCOUNTER — Telehealth: Payer: Self-pay | Admitting: Neurology

## 2019-08-01 NOTE — Telephone Encounter (Signed)
Patient called in and was wanting to know about PA for his trokendi medication. Spoke with Herbert Seta as she was working on this back at the beginning of June. She said we haven't received anything from pharm. I spoke with patient's pharm [facility] to see if they had valid PA on file for medication and they did. I called the patient and let him know. Patel patient but Karel Jarvis was overseeing the medication conversation. Thanks!

## 2019-08-11 ENCOUNTER — Ambulatory Visit (INDEPENDENT_AMBULATORY_CARE_PROVIDER_SITE_OTHER): Payer: 59

## 2019-08-11 DIAGNOSIS — J309 Allergic rhinitis, unspecified: Secondary | ICD-10-CM

## 2019-08-18 ENCOUNTER — Ambulatory Visit (INDEPENDENT_AMBULATORY_CARE_PROVIDER_SITE_OTHER): Payer: 59

## 2019-08-18 DIAGNOSIS — J309 Allergic rhinitis, unspecified: Secondary | ICD-10-CM

## 2019-08-25 ENCOUNTER — Ambulatory Visit (INDEPENDENT_AMBULATORY_CARE_PROVIDER_SITE_OTHER): Payer: 59

## 2019-08-25 DIAGNOSIS — J309 Allergic rhinitis, unspecified: Secondary | ICD-10-CM

## 2019-09-01 ENCOUNTER — Ambulatory Visit (INDEPENDENT_AMBULATORY_CARE_PROVIDER_SITE_OTHER): Payer: 59

## 2019-09-01 DIAGNOSIS — J309 Allergic rhinitis, unspecified: Secondary | ICD-10-CM

## 2019-09-09 ENCOUNTER — Ambulatory Visit (INDEPENDENT_AMBULATORY_CARE_PROVIDER_SITE_OTHER): Payer: 59

## 2019-09-09 DIAGNOSIS — J309 Allergic rhinitis, unspecified: Secondary | ICD-10-CM

## 2019-09-10 ENCOUNTER — Encounter: Payer: Self-pay | Admitting: Neurology

## 2019-09-10 NOTE — Progress Notes (Signed)
Denial from Insurance for the Trokendi medication- per ins generic topiramate medication is preferred but medication does not help patient.   Sent denial to Dr. Allena Katz- asking what to do? Awaiting response.

## 2019-09-11 NOTE — Progress Notes (Addendum)
Faxed appeal paperwork to [facility] 09/11/19 for them to process appeal from denial.   Called on 09/16/19 to make sure [facility] got all paperwork for the appeal; She said yes. CASE #: [id number]. Said they have until 09/26/19 to make decision.

## 2019-09-17 ENCOUNTER — Encounter: Payer: Self-pay | Admitting: Family Medicine

## 2019-09-17 ENCOUNTER — Ambulatory Visit (INDEPENDENT_AMBULATORY_CARE_PROVIDER_SITE_OTHER): Payer: 59 | Admitting: Family Medicine

## 2019-09-17 ENCOUNTER — Other Ambulatory Visit: Payer: Self-pay

## 2019-09-17 DIAGNOSIS — J309 Allergic rhinitis, unspecified: Secondary | ICD-10-CM | POA: Insufficient documentation

## 2019-09-17 DIAGNOSIS — H1013 Acute atopic conjunctivitis, bilateral: Secondary | ICD-10-CM | POA: Diagnosis not present

## 2019-09-17 HISTORY — DX: Allergic rhinitis, unspecified: J30.9

## 2019-09-17 NOTE — Progress Notes (Addendum)
RE: Joshua Wise MRN: [id number] DOB: 1990-04-10 Date of Telemedicine Visit: 09/17/2019  Referring provider: Myles Lipps, MD Primary care provider: Myles Lipps, MD  Chief Complaint: Allergic Rhinitis  (doing much better)   Telemedicine Follow Up Visit via Telephone: I connected with Joshua Wise for a follow up on 09/17/19 by telephone and verified that I am speaking with the correct person using two identifiers.   I discussed the limitations, risks, security and privacy concerns of performing an evaluation and management service by telephone and the availability of in person appointments. I also discussed with the patient that there may be a patient responsible charge related to this service. The patient expressed understanding and agreed to proceed.  Patient is at home  Provider is at the office.  Visit start time: 331 Visit end time: 69 Insurance consent/check in by: [facility] consent and medical assistant/nurse: Damita  History of Present Illness: He is a 29 y.o. male, who is being followed for allergic rhinitis and allergic conjunctivitis. His previous allergy office visit was on 03/07/2019 with Dr. Delorse Lek. At today's visit, he reports that his allergic rhinitis had been well controlled until about 1 week ago when he began to experience nasal congestion, clear rhinorrhea, and some facial pressure. He denies fever, sweats, chills, and sick contacts. He reports that he has recently received a negative COVID test. He continues Xyzal 5 mg once a day, Flonase as needed, and frequent use of nasal saline rinses. He continues allergen immunotherapy with no local reactions. He reports that his symptoms of allergic rhinitis have decreased while continuing on allergen immunotherapy. Allergic rhinitis is reported as well controlled with no current medical intervention. His current medications are listed in the chart.   Assessment and Plan: Joshua Wise is a 29 y.o. male  with: Patient Instructions  Allergic rhinitis Begin prednisone 10 mg tablets. Take 2 tablets once a day for 4 days, then take 1 tablet on the 5th day, then stop. Continue Mucinex [phone] mg once a day and increase hydration as toilerated in order to thin out mucus Stop Xyzal 5 mg for 1 week, then restart once a day as needed for a runny nose Begin Flonase 2 sprays in each nostril once a day as needed for a stuffy nose.  In the right nostril, point the applicator out toward the right ear. In the left nostril, point the applicator out toward the left ear Continue saline nasal rinses as needed for nasal symptoms. Use this before any medicated nasal sprays for best result Continue allergen immunotherapy and have access to an EpiPen Continue allergen avoidance measures directed toward grass pollen, weed pollen, molds, and cockroach as listed below  Allergic conjunctivitis Some over the counter eye drops include Pataday one drop in each eye once a day as needed for red, itchy eyes OR Zaditor one drop in each eye twice a day as needed for red itchy eyes.  Call the clinic if this treatment plan is not working well for you  Follow up in 3 months or sooner if needed.    Return in about 3 months (around 12/18/2019), or if symptoms worsen or fail to improve.   Medication List:  Current Outpatient Medications  Medication Sig Dispense Refill  . fluticasone (FLONASE) 50 MCG/ACT nasal spray Place 2 sprays into both nostrils daily as needed for allergies or rhinitis.    Marland Kitchen guanFACINE (INTUNIV) 1 MG TB24 ER tablet Take 1 mg by mouth daily.    Marland Kitchen levocetirizine (XYZAL)  5 MG tablet Take 5 mg by mouth as needed.     Marland Kitchen VYVANSE 20 MG capsule Take 20 mg by mouth every morning.    . SUMAtriptan (IMITREX) 25 MG tablet Take 1 tablet at headache onset.  May repeat in 2 hours if headache persists or recurs. (Patient not taking: Reported on 09/17/2019) 10 tablet 3  . Topiramate ER (TROKENDI XR) 25 MG CP24 Take 1  capsule (25 mg total) by mouth daily. (Patient not taking: Reported on 09/17/2019) 30 capsule 11   No current facility-administered medications for this visit.   Allergies: No Known Allergies I reviewed his past medical history, social history, family history, and environmental history and no significant changes have been reported from previous visit on 03/27/2019.  Objective: Physical Exam Not obtained as encounter was done via telephone.   Previous notes and tests were reviewed.  I discussed the assessment and treatment plan with the patient. The patient was provided an opportunity to ask questions and all were answered. The patient agreed with the plan and demonstrated an understanding of the instructions.   The patient was advised to call back or seek an in-person evaluation if the symptoms worsen or if the condition fails to improve as anticipated.  I provided 39 minutes of non-face-to-face time during this encounter.  It was my pleasure to participate in Joshua Wise's care today. Please feel free to contact me with any questions or concerns.   Sincerely,  Thermon Leyland, FNP  ________________________________________________  I have provided oversight concerning Joshua Wise's evaluation and treatment of this patient's health issues addressed during today's encounter.  I agree with the assessment and therapeutic plan as outlined in the note.   Signed,   R Jorene Guest, MD

## 2019-09-17 NOTE — Patient Instructions (Addendum)
Allergic rhinitis Begin prednisone 10 mg tablets. Take 2 tablets once a day for 4 days, then take 1 tablet on the 5th day, then stop. Continue Mucinex [phone] mg once a day and increase hydration as toilerated in order to thin out mucus Stop Xyzal 5 mg for 1 week, then restart once a day as needed for a runny nose Begin Flonase 2 sprays in each nostril once a day as needed for a stuffy nose.  In the right nostril, point the applicator out toward the right ear. In the left nostril, point the applicator out toward the left ear Continue saline nasal rinses as needed for nasal symptoms. Use this before any medicated nasal sprays for best result Continue allergen immunotherapy and have access to an EpiPen Continue allergen avoidance measures directed toward grass pollen, weed pollen, molds, and cockroach as listed below  Allergic conjunctivitis Some over the counter eye drops include Pataday one drop in each eye once a day as needed for red, itchy eyes OR Zaditor one drop in each eye twice a day as needed for red itchy eyes.  Call the clinic if this treatment plan is not working well for you  Follow up in 3 months or sooner if needed.

## 2019-09-24 ENCOUNTER — Ambulatory Visit (INDEPENDENT_AMBULATORY_CARE_PROVIDER_SITE_OTHER): Payer: 59 | Admitting: *Deleted

## 2019-09-24 DIAGNOSIS — J309 Allergic rhinitis, unspecified: Secondary | ICD-10-CM | POA: Diagnosis not present

## 2019-09-26 ENCOUNTER — Ambulatory Visit: Payer: Self-pay | Admitting: Family Medicine

## 2019-09-29 NOTE — Progress Notes (Addendum)
Appeal was denied- next step is another level of appeal. Not able to do peer to peer at this time.   I have mailed out all the info for a second level appeal today (09/29/19) to the address for [employer] [address] [address] [place]. Awaiting determination for 2nd level appeal.   11/13/19- second level appeal was denied. Spoke with Rep to see if we could just do a peer-to-peer and that is not a step we can do for this request. I faxed info for the third level appeal per Rep. Awaiting determination.   11/26/19- third appeal denied. No other options at this time.

## 2019-10-01 ENCOUNTER — Telehealth: Payer: Self-pay | Admitting: Family Medicine

## 2019-10-01 NOTE — Telephone Encounter (Signed)
10/01/2019 - PATIENT HAS AN ANNUAL PHYSICAL SCHEDULED WITH DR. Darcel Bayley ON 10/15/2019 AT 8:00 am. SHE WILL NOT BE IN THE OFFICE THIS DAY. I TRIED TO CALL THE PATIENT TO LET HIM KNOW THIS AND ALSO THAT SHE WILL BE LEAVING OUR PRACTICE. WE WILL NEED TO SCHEDULE A TRANSFER OF CARE WITH ANOTHER ONE OF OUR PROVIDERS IF HE WANTS TO CONTINUE HERE. MBC

## 2019-10-14 ENCOUNTER — Ambulatory Visit (INDEPENDENT_AMBULATORY_CARE_PROVIDER_SITE_OTHER): Payer: 59 | Admitting: *Deleted

## 2019-10-14 DIAGNOSIS — J309 Allergic rhinitis, unspecified: Secondary | ICD-10-CM | POA: Diagnosis not present

## 2019-10-15 ENCOUNTER — Encounter: Payer: 59 | Admitting: Family Medicine

## 2019-11-10 ENCOUNTER — Ambulatory Visit (INDEPENDENT_AMBULATORY_CARE_PROVIDER_SITE_OTHER): Payer: 59

## 2019-11-10 DIAGNOSIS — J309 Allergic rhinitis, unspecified: Secondary | ICD-10-CM | POA: Diagnosis not present

## 2019-12-03 ENCOUNTER — Ambulatory Visit (INDEPENDENT_AMBULATORY_CARE_PROVIDER_SITE_OTHER): Payer: 59 | Admitting: *Deleted

## 2019-12-03 DIAGNOSIS — J309 Allergic rhinitis, unspecified: Secondary | ICD-10-CM | POA: Diagnosis not present

## 2019-12-24 ENCOUNTER — Ambulatory Visit (INDEPENDENT_AMBULATORY_CARE_PROVIDER_SITE_OTHER): Payer: 59 | Admitting: *Deleted

## 2019-12-24 DIAGNOSIS — J309 Allergic rhinitis, unspecified: Secondary | ICD-10-CM

## 2019-12-29 DIAGNOSIS — J3089 Other allergic rhinitis: Secondary | ICD-10-CM

## 2019-12-29 NOTE — Progress Notes (Signed)
VIALS EXP 12-30-20 

## 2020-01-01 DIAGNOSIS — J301 Allergic rhinitis due to pollen: Secondary | ICD-10-CM

## 2020-01-05 NOTE — Progress Notes (Signed)
ADDITIONAL LABEL NEEDED 

## 2020-01-29 ENCOUNTER — Ambulatory Visit (INDEPENDENT_AMBULATORY_CARE_PROVIDER_SITE_OTHER): Payer: 59 | Admitting: *Deleted

## 2020-01-29 DIAGNOSIS — J309 Allergic rhinitis, unspecified: Secondary | ICD-10-CM

## 2020-02-18 ENCOUNTER — Ambulatory Visit (INDEPENDENT_AMBULATORY_CARE_PROVIDER_SITE_OTHER): Payer: 59

## 2020-02-18 DIAGNOSIS — J309 Allergic rhinitis, unspecified: Secondary | ICD-10-CM | POA: Diagnosis not present

## 2020-03-10 ENCOUNTER — Ambulatory Visit (INDEPENDENT_AMBULATORY_CARE_PROVIDER_SITE_OTHER): Payer: 59

## 2020-03-10 DIAGNOSIS — J309 Allergic rhinitis, unspecified: Secondary | ICD-10-CM | POA: Diagnosis not present

## 2020-03-26 ENCOUNTER — Ambulatory Visit: Payer: 59 | Admitting: Allergy

## 2020-03-26 ENCOUNTER — Ambulatory Visit: Payer: 59 | Admitting: Neurology

## 2020-04-01 ENCOUNTER — Ambulatory Visit: Payer: 59 | Admitting: Allergy

## 2020-04-01 ENCOUNTER — Telehealth: Payer: Self-pay

## 2020-04-01 DIAGNOSIS — J309 Allergic rhinitis, unspecified: Secondary | ICD-10-CM

## 2020-04-01 NOTE — Telephone Encounter (Signed)
Called the patient twice and left a voice message regarding his 4:10pm televisit.  The second time I called I left a message for him  to call us to reschedule for another day.

## 2020-04-06 ENCOUNTER — Ambulatory Visit (INDEPENDENT_AMBULATORY_CARE_PROVIDER_SITE_OTHER): Payer: 59 | Admitting: *Deleted

## 2020-04-06 DIAGNOSIS — J309 Allergic rhinitis, unspecified: Secondary | ICD-10-CM | POA: Diagnosis not present

## 2020-04-07 ENCOUNTER — Other Ambulatory Visit: Payer: Self-pay

## 2020-04-07 ENCOUNTER — Encounter: Payer: Self-pay | Admitting: Allergy

## 2020-04-07 ENCOUNTER — Ambulatory Visit (INDEPENDENT_AMBULATORY_CARE_PROVIDER_SITE_OTHER): Payer: 59 | Admitting: Allergy

## 2020-04-07 VITALS — BP 135/80 | HR 59 | Ht 73.0 in | Wt 170.0 lb

## 2020-04-07 DIAGNOSIS — H1013 Acute atopic conjunctivitis, bilateral: Secondary | ICD-10-CM | POA: Diagnosis not present

## 2020-04-07 DIAGNOSIS — J3089 Other allergic rhinitis: Secondary | ICD-10-CM | POA: Diagnosis not present

## 2020-04-07 DIAGNOSIS — H6983 Other specified disorders of Eustachian tube, bilateral: Secondary | ICD-10-CM

## 2020-04-07 NOTE — Progress Notes (Signed)
RE: Joshua Wise MRN: [id number] DOB: 1991-01-29 Date of Telemedicine Visit: 04/07/2020  Referring provider: Lezlie Lye, Meda Coffee, * Primary care provider: Patient, No Pcp Wise  Chief Complaint: Allergic Rhinitis  (Says allergies are much better. Fluid in ears is a concern for patient. Says he can hear the water in his ears daily )   Telemedicine Follow Up Visit via Telephone: I connected with Joshua Wise for a follow up on 04/07/20 by telephone and verified that I am speaking with the correct person using two identifiers.   I discussed the limitations, risks, security and privacy concerns of performing an evaluation and management service by telephone and the availability of in person appointments. I also discussed with the patient that there may be a patient responsible charge related to this service. The patient expressed understanding and agreed to proceed.  Patient is at home/work.  Provider is at the office.  Visit start time: 1031 Visit end time: 43 Insurance consent/check in by: [facility]  Medical consent and medical assistant/nurse: Cree  History of Present Illness: He is a 30 y.o. male, who is being followed for allergic rhinitis with conjunctivitis. His previous visit was a telemedicine visit on 09/17/2019 with Thermon Leyland, FNP.   He states he has been doing well over the past year without any major health changes, surgeries or hospitalizations. His allergy symptoms are much improved from previous years.  He is on allergen immunotherapy and tolerating this well without any large local or systemic reactions.  He is now at every 3-week dosing with plans to move to monthly in the fall.  He is still taking Xyzal daily and performing his nasal saline rinses about 3-5 times a week.  He will use Flonase as needed for congestion. His main concern today is he has been having sensation of fluid in his ears and states he can hear them crackling and popping.  Assessment and  Plan: Joshua Wise is a 30 y.o. male with:   Allergic rhinitis with conjunctivitis    - continue avoidance measures for grasses, weeds, trees, dust mites, molds, cockroach    -Allergen immunotherapy reviewed and he will continue allergen immunotherapy Wise protocol.  He is at maintenance dosing will continue on for now as he is still requiring antihistamine use    -Continue daily Xyzal at this time.     -Continues nasal saline rinses periodically during the week   - for nasal congestion use Flonase 2 sprays each nostril daily as needed. Use for 1-2 weeks at time before stopping once symptoms improve    - for itchy/watery/red eyes use over-the-counter Pataday 1 drop each eye daily if needed  Eustachian tube dysfunction   -Currently with crackling and popping sensation of the ear   -Advised to use Flonase daily for at least the next week and see if this improves symptoms.  If not advised to let us know we can have him try an [facility] sample for deeper deposition of the steroid to see if that will improve his ear symptoms.    Follow-up 12 months or sooner if needed  Diagnostics: None.  Medication List:  Current Outpatient Medications  Medication Sig Dispense Refill  . fluticasone (FLONASE) 50 MCG/ACT nasal spray Place 2 sprays into both nostrils daily as needed for allergies or rhinitis.    Marland Kitchen levocetirizine (XYZAL) 5 MG tablet Take 5 mg by mouth as needed.     Marland Kitchen guanFACINE (INTUNIV) 1 MG TB24 ER tablet Take 1 mg by mouth daily. (Patient  not taking: Reported on 04/07/2020)    . SUMAtriptan (IMITREX) 25 MG tablet Take 1 tablet at headache onset.  May repeat in 2 hours if headache persists or recurs. (Patient not taking: No sig reported) 10 tablet 3  . Topiramate ER (TROKENDI XR) 25 MG CP24 Take 1 capsule (25 mg total) by mouth daily. (Patient not taking: No sig reported) 30 capsule 11  . VYVANSE 20 MG capsule Take 20 mg by mouth every morning. (Patient not taking: Reported on 04/07/2020)     No  current facility-administered medications for this visit.   Allergies: No Known Allergies I reviewed his past medical history, social history, family history, and environmental history and no significant changes have been reported from previous visit on 09/17/2019.  Review of Systems  Constitutional: Negative.   HENT:       See HPI  Eyes: Negative.   Respiratory: Negative.   Cardiovascular: Negative.   Gastrointestinal: Negative.   Musculoskeletal: Negative.   Skin: Negative.   Neurological: Negative.    Objective: Physical Exam Not obtained as encounter was done via telephone.   Previous notes and tests were reviewed.  I discussed the assessment and treatment plan with the patient. The patient was provided an opportunity to ask questions and all were answered. The patient agreed with the plan and demonstrated an understanding of the instructions.   The patient was advised to call back or seek an in-person evaluation if the symptoms worsen or if the condition fails to improve as anticipated.  I provided 20 minutes of non-face-to-face time during this encounter.  It was my pleasure to participate in Joshua Wise's care today. Please feel free to contact me with any questions or concerns.   Sincerely,  Lavoris Canizales Larose Hires, MD

## 2020-04-07 NOTE — Patient Instructions (Addendum)
Allergic rhinitis with conjunctivitis    - continue avoidance measures for grasses, weeds, trees, dust mites, molds, cockroach    - continue allergen immunotherapy per protocol.  He is at maintenance dosing will continue on for now as he is still requiring antihistamine use    -Continue daily Xyzal at this time.     -Continues nasal saline rinses periodically during the week   - for nasal congestion use Flonase 2 sprays each nostril daily as needed. Use for 1-2 weeks at time before stopping once symptoms improve    - for itchy/watery/red eyes use over-the-counter Pataday 1 drop each eye daily if needed  Eustachian tube dysfunction   -Currently with crackling and popping sensation of the ear   -Advised to use Flonase daily for at least the next week and see if this improves symptoms.  If not advised to let us know we can have him try an [facility] sample for deeper deposition of the steroid to see if that will improve his ear symptoms.    Follow-up 12 months or sooner if needed

## 2020-07-01 DIAGNOSIS — M9903 Segmental and somatic dysfunction of lumbar region: Secondary | ICD-10-CM | POA: Diagnosis not present

## 2020-07-05 DIAGNOSIS — M9903 Segmental and somatic dysfunction of lumbar region: Secondary | ICD-10-CM | POA: Diagnosis not present

## 2020-07-08 DIAGNOSIS — M9903 Segmental and somatic dysfunction of lumbar region: Secondary | ICD-10-CM | POA: Diagnosis not present

## 2020-07-12 DIAGNOSIS — M9903 Segmental and somatic dysfunction of lumbar region: Secondary | ICD-10-CM | POA: Diagnosis not present

## 2020-07-19 DIAGNOSIS — M9903 Segmental and somatic dysfunction of lumbar region: Secondary | ICD-10-CM | POA: Diagnosis not present

## 2020-07-23 DIAGNOSIS — F902 Attention-deficit hyperactivity disorder, combined type: Secondary | ICD-10-CM | POA: Diagnosis not present

## 2020-07-23 DIAGNOSIS — Z79899 Other long term (current) drug therapy: Secondary | ICD-10-CM | POA: Diagnosis not present

## 2020-07-26 DIAGNOSIS — M9903 Segmental and somatic dysfunction of lumbar region: Secondary | ICD-10-CM | POA: Diagnosis not present

## 2020-08-05 DIAGNOSIS — M9903 Segmental and somatic dysfunction of lumbar region: Secondary | ICD-10-CM | POA: Diagnosis not present

## 2020-08-19 DIAGNOSIS — M9903 Segmental and somatic dysfunction of lumbar region: Secondary | ICD-10-CM | POA: Diagnosis not present

## 2020-09-09 DIAGNOSIS — M9903 Segmental and somatic dysfunction of lumbar region: Secondary | ICD-10-CM | POA: Diagnosis not present

## 2020-10-07 DIAGNOSIS — M9903 Segmental and somatic dysfunction of lumbar region: Secondary | ICD-10-CM | POA: Diagnosis not present

## 2020-10-26 ENCOUNTER — Telehealth: Payer: Self-pay | Admitting: *Deleted

## 2020-10-26 NOTE — Telephone Encounter (Signed)
Patient will need to restart at .10 of Gold Vials since he doesn't have any mixed down. Called and left a voicemail asking for patient to return call to inform, he can come in as early as tomorrow to get his injection.

## 2020-10-26 NOTE — Telephone Encounter (Signed)
Patient returned call to the office.  Informed of Allergy Injection restart dose per Dr. Delorse Lek.  Patient aware of time for mix down of Gold Vial and restarting at .10 ml.  Patient will come to the office 9/28 or 9/29 to restart injections.  Patient voiced understanding.

## 2020-10-26 NOTE — Telephone Encounter (Signed)
Vials have been mixed down and have been filed away in the allergy trays.

## 2020-10-26 NOTE — Telephone Encounter (Signed)
Patient last received his allergy injection on 04/06/2020 at .10 from his Red vials. He is normally every 3 weeks at .50. Please advise where the patient needs to be backed down to. Thank You.

## 2020-10-27 DIAGNOSIS — M9903 Segmental and somatic dysfunction of lumbar region: Secondary | ICD-10-CM | POA: Diagnosis not present

## 2020-11-02 DIAGNOSIS — M9903 Segmental and somatic dysfunction of lumbar region: Secondary | ICD-10-CM | POA: Diagnosis not present

## 2020-11-03 ENCOUNTER — Ambulatory Visit (INDEPENDENT_AMBULATORY_CARE_PROVIDER_SITE_OTHER): Payer: BC Managed Care – PPO

## 2020-11-03 DIAGNOSIS — J309 Allergic rhinitis, unspecified: Secondary | ICD-10-CM | POA: Diagnosis not present

## 2020-11-09 DIAGNOSIS — F902 Attention-deficit hyperactivity disorder, combined type: Secondary | ICD-10-CM | POA: Diagnosis not present

## 2020-11-09 DIAGNOSIS — Z79899 Other long term (current) drug therapy: Secondary | ICD-10-CM | POA: Diagnosis not present

## 2020-11-10 ENCOUNTER — Ambulatory Visit (INDEPENDENT_AMBULATORY_CARE_PROVIDER_SITE_OTHER): Payer: BC Managed Care – PPO

## 2020-11-10 DIAGNOSIS — J309 Allergic rhinitis, unspecified: Secondary | ICD-10-CM

## 2020-11-15 DIAGNOSIS — M9903 Segmental and somatic dysfunction of lumbar region: Secondary | ICD-10-CM | POA: Diagnosis not present

## 2020-11-16 ENCOUNTER — Ambulatory Visit (INDEPENDENT_AMBULATORY_CARE_PROVIDER_SITE_OTHER): Payer: BC Managed Care – PPO

## 2020-11-16 DIAGNOSIS — J309 Allergic rhinitis, unspecified: Secondary | ICD-10-CM | POA: Diagnosis not present

## 2020-11-24 DIAGNOSIS — Z6825 Body mass index (BMI) 25.0-25.9, adult: Secondary | ICD-10-CM | POA: Diagnosis not present

## 2020-11-24 DIAGNOSIS — Z0000 Encounter for general adult medical examination without abnormal findings: Secondary | ICD-10-CM | POA: Diagnosis not present

## 2020-11-25 ENCOUNTER — Ambulatory Visit (INDEPENDENT_AMBULATORY_CARE_PROVIDER_SITE_OTHER): Payer: BC Managed Care – PPO | Admitting: *Deleted

## 2020-11-25 DIAGNOSIS — J309 Allergic rhinitis, unspecified: Secondary | ICD-10-CM

## 2020-11-29 DIAGNOSIS — M9903 Segmental and somatic dysfunction of lumbar region: Secondary | ICD-10-CM | POA: Diagnosis not present

## 2020-11-30 DIAGNOSIS — J301 Allergic rhinitis due to pollen: Secondary | ICD-10-CM | POA: Diagnosis not present

## 2020-11-30 NOTE — Progress Notes (Signed)
VIALS MADE. EXP 11-30-21 

## 2020-12-01 DIAGNOSIS — J3089 Other allergic rhinitis: Secondary | ICD-10-CM | POA: Diagnosis not present

## 2020-12-07 ENCOUNTER — Ambulatory Visit (INDEPENDENT_AMBULATORY_CARE_PROVIDER_SITE_OTHER): Payer: BC Managed Care – PPO | Admitting: *Deleted

## 2020-12-07 DIAGNOSIS — J309 Allergic rhinitis, unspecified: Secondary | ICD-10-CM

## 2020-12-14 ENCOUNTER — Ambulatory Visit (INDEPENDENT_AMBULATORY_CARE_PROVIDER_SITE_OTHER): Payer: BC Managed Care – PPO | Admitting: *Deleted

## 2020-12-14 DIAGNOSIS — J309 Allergic rhinitis, unspecified: Secondary | ICD-10-CM | POA: Diagnosis not present

## 2020-12-16 DIAGNOSIS — M9903 Segmental and somatic dysfunction of lumbar region: Secondary | ICD-10-CM | POA: Diagnosis not present

## 2020-12-21 ENCOUNTER — Ambulatory Visit (INDEPENDENT_AMBULATORY_CARE_PROVIDER_SITE_OTHER): Payer: BC Managed Care – PPO

## 2020-12-21 DIAGNOSIS — J309 Allergic rhinitis, unspecified: Secondary | ICD-10-CM | POA: Diagnosis not present

## 2020-12-21 MED ORDER — EPINEPHRINE 0.3 MG/0.3ML IJ SOAJ: 0.3000 mg | INTRAMUSCULAR | 1 refills | Status: DC | PRN

## 2020-12-27 ENCOUNTER — Ambulatory Visit (INDEPENDENT_AMBULATORY_CARE_PROVIDER_SITE_OTHER): Payer: BC Managed Care – PPO

## 2020-12-27 DIAGNOSIS — J309 Allergic rhinitis, unspecified: Secondary | ICD-10-CM | POA: Diagnosis not present

## 2021-01-03 ENCOUNTER — Ambulatory Visit (INDEPENDENT_AMBULATORY_CARE_PROVIDER_SITE_OTHER): Payer: BC Managed Care – PPO

## 2021-01-03 DIAGNOSIS — J309 Allergic rhinitis, unspecified: Secondary | ICD-10-CM

## 2021-01-03 DIAGNOSIS — M9903 Segmental and somatic dysfunction of lumbar region: Secondary | ICD-10-CM | POA: Diagnosis not present

## 2021-01-10 ENCOUNTER — Ambulatory Visit (INDEPENDENT_AMBULATORY_CARE_PROVIDER_SITE_OTHER): Payer: BC Managed Care – PPO

## 2021-01-10 DIAGNOSIS — J309 Allergic rhinitis, unspecified: Secondary | ICD-10-CM

## 2021-02-01 ENCOUNTER — Ambulatory Visit (INDEPENDENT_AMBULATORY_CARE_PROVIDER_SITE_OTHER): Payer: BC Managed Care – PPO | Admitting: *Deleted

## 2021-02-01 DIAGNOSIS — M9903 Segmental and somatic dysfunction of lumbar region: Secondary | ICD-10-CM | POA: Diagnosis not present

## 2021-02-01 DIAGNOSIS — J309 Allergic rhinitis, unspecified: Secondary | ICD-10-CM | POA: Diagnosis not present

## 2021-02-08 DIAGNOSIS — Z79899 Other long term (current) drug therapy: Secondary | ICD-10-CM | POA: Diagnosis not present

## 2021-02-08 DIAGNOSIS — F902 Attention-deficit hyperactivity disorder, combined type: Secondary | ICD-10-CM | POA: Diagnosis not present

## 2021-02-09 DIAGNOSIS — F902 Attention-deficit hyperactivity disorder, combined type: Secondary | ICD-10-CM | POA: Diagnosis not present

## 2021-02-10 ENCOUNTER — Ambulatory Visit (INDEPENDENT_AMBULATORY_CARE_PROVIDER_SITE_OTHER): Payer: BC Managed Care – PPO

## 2021-02-10 DIAGNOSIS — J309 Allergic rhinitis, unspecified: Secondary | ICD-10-CM | POA: Diagnosis not present

## 2021-02-14 DIAGNOSIS — M9903 Segmental and somatic dysfunction of lumbar region: Secondary | ICD-10-CM | POA: Diagnosis not present

## 2021-02-16 ENCOUNTER — Ambulatory Visit (INDEPENDENT_AMBULATORY_CARE_PROVIDER_SITE_OTHER): Payer: BC Managed Care – PPO

## 2021-02-16 DIAGNOSIS — J309 Allergic rhinitis, unspecified: Secondary | ICD-10-CM | POA: Diagnosis not present

## 2021-02-21 ENCOUNTER — Ambulatory Visit (INDEPENDENT_AMBULATORY_CARE_PROVIDER_SITE_OTHER): Payer: BC Managed Care – PPO

## 2021-02-21 DIAGNOSIS — J309 Allergic rhinitis, unspecified: Secondary | ICD-10-CM

## 2021-02-28 ENCOUNTER — Ambulatory Visit (INDEPENDENT_AMBULATORY_CARE_PROVIDER_SITE_OTHER): Payer: BC Managed Care – PPO

## 2021-02-28 DIAGNOSIS — J309 Allergic rhinitis, unspecified: Secondary | ICD-10-CM

## 2021-03-07 DIAGNOSIS — M9903 Segmental and somatic dysfunction of lumbar region: Secondary | ICD-10-CM | POA: Diagnosis not present

## 2021-03-10 ENCOUNTER — Ambulatory Visit (INDEPENDENT_AMBULATORY_CARE_PROVIDER_SITE_OTHER): Payer: BC Managed Care – PPO

## 2021-03-10 DIAGNOSIS — J309 Allergic rhinitis, unspecified: Secondary | ICD-10-CM | POA: Diagnosis not present

## 2021-03-17 ENCOUNTER — Ambulatory Visit (INDEPENDENT_AMBULATORY_CARE_PROVIDER_SITE_OTHER): Payer: BC Managed Care – PPO

## 2021-03-17 DIAGNOSIS — J309 Allergic rhinitis, unspecified: Secondary | ICD-10-CM | POA: Diagnosis not present

## 2021-03-23 ENCOUNTER — Ambulatory Visit (INDEPENDENT_AMBULATORY_CARE_PROVIDER_SITE_OTHER): Payer: BC Managed Care – PPO

## 2021-03-23 DIAGNOSIS — J309 Allergic rhinitis, unspecified: Secondary | ICD-10-CM | POA: Diagnosis not present

## 2021-03-28 ENCOUNTER — Ambulatory Visit (INDEPENDENT_AMBULATORY_CARE_PROVIDER_SITE_OTHER): Payer: BC Managed Care – PPO

## 2021-03-28 DIAGNOSIS — J309 Allergic rhinitis, unspecified: Secondary | ICD-10-CM

## 2021-03-28 DIAGNOSIS — M9903 Segmental and somatic dysfunction of lumbar region: Secondary | ICD-10-CM | POA: Diagnosis not present

## 2021-04-07 ENCOUNTER — Encounter: Payer: Self-pay | Admitting: Allergy

## 2021-04-07 ENCOUNTER — Other Ambulatory Visit: Payer: Self-pay

## 2021-04-07 ENCOUNTER — Ambulatory Visit (INDEPENDENT_AMBULATORY_CARE_PROVIDER_SITE_OTHER): Payer: BC Managed Care – PPO | Admitting: Allergy

## 2021-04-07 VITALS — BP 130/78 | HR 65 | Temp 97.8°F | Resp 16 | Ht 73.0 in | Wt 183.1 lb

## 2021-04-07 DIAGNOSIS — J309 Allergic rhinitis, unspecified: Secondary | ICD-10-CM

## 2021-04-07 DIAGNOSIS — H1013 Acute atopic conjunctivitis, bilateral: Secondary | ICD-10-CM | POA: Diagnosis not present

## 2021-04-07 DIAGNOSIS — J3089 Other allergic rhinitis: Secondary | ICD-10-CM

## 2021-04-07 DIAGNOSIS — H6983 Other specified disorders of Eustachian tube, bilateral: Secondary | ICD-10-CM

## 2021-04-07 MED ORDER — EPINEPHRINE 0.3 MG/0.3ML IJ SOAJ: 0.3000 mg | INTRAMUSCULAR | 1 refills | Status: DC | PRN

## 2021-04-07 NOTE — Patient Instructions (Addendum)
Allergic rhinitis with conjunctivitis ?   - continue avoidance measures for grasses, weeds, trees, dust mites, molds, cockroach ?   - continue allergen immunotherapy per protocol.  Will get you back up to maintenance dosing on your allergy shots ?   -Use Allegra 180mg  daily as needed for now.  Alternate every 6 months or so between this and Xyzal to ensure efficacy.   See if you still need daily antihistamine dosing at this time.    ?   -Continues nasal saline rinses periodically during the week ?   -nasal spray as below ?   -Nasal saline spray to help keep nose moisturized ?   - for itchy/watery/red eyes use over-the-counter Pataday 1 drop each eye daily if needed ? ?Eustachian tube dysfunction ?  -Currently with crackling and popping sensation of the ear ?  -Flonase has not been effective thus will have you try Xhance nasal spray device.  Dosing max is 2 sprays twice a day.  This device allows for deeper deposition of nasal spray into your sinuses and has the best chance of impacting your ear symptoms ? ? ?Follow-up 6-12 months or sooner if needed ?

## 2021-04-07 NOTE — Progress Notes (Signed)
? ? ?Follow-up Note ? ?RE: Joshua Wise MRN: [id number] DOB: 11-Oct-1990 ?Date of Office Visit: 04/07/2021 ? ? ?History of present illness: ?Joshua Wise is a 31 y.o. male presenting today for follow-up of allergic rhinitis with conjunctivitis and eustachian tube dysfunction.  He was last seen in the office on 04/07/2020 by myself.  He has not had any major health changes in the past year. ?He is still having issues with cracking and popping of his ears.  It is bothersome.  He is using Flonase but this has not helped.  He states he feels like his nose is just getting dry from use.  He also continues on Xyzal daily but ran out today.  He is not sure if it is still as effective as it was as he is wondering if he should change to another antihistamine at this time.  He is back on allergen immunotherapy.  He did have about a 5-month time pause on immunotherapy last year after he had his child.  He is not quite back up to maintenance yet.  He is tolerating injections without large local or systemic reactions. ? ?Review of systems: ?Review of Systems  ?Constitutional: Negative.   ?HENT:    ?     See HPI  ?Eyes: Negative.   ?Respiratory: Negative.    ?Cardiovascular: Negative.   ?Musculoskeletal: Negative.   ?Skin: Negative.   ?Allergic/Immunologic: Negative.   ?Neurological: Negative.    ? ?All other systems negative unless noted above in HPI ? ?Past medical/social/surgical/family history have been reviewed and are unchanged unless specifically indicated below. ? ?No changes ? ?Medication List: ?Current Outpatient Medications  ?Medication Sig Dispense Refill  ? guanFACINE (INTUNIV) 1 MG TB24 ER tablet Take 1 mg by mouth daily.    ? levocetirizine (XYZAL) 5 MG tablet Take 5 mg by mouth as needed.     ? EPINEPHrine (AUVI-Q) 0.3 mg/0.3 mL IJ SOAJ injection Inject 0.3 mg into the muscle as needed for anaphylaxis. 1 each 1  ? fluticasone (FLONASE) 50 MCG/ACT nasal spray Place 2 sprays into both nostrils daily as needed for  allergies or rhinitis. (Patient not taking: Reported on 04/07/2021)    ? ?No current facility-administered medications for this visit.  ?  ? ?Known medication allergies: ?No Known Allergies ? ? ?Physical examination: ?Blood pressure 130/78, pulse 65, temperature 97.8 ?F (36.6 ?C), resp. rate 16, height 6\' 1"  (1.854 m), weight 183 lb 2 oz (83.1 kg), SpO2 98 %. ? ?General: Alert, interactive, in no acute distress. ?HEENT: PERRLA, TMs pearly gray, turbinates non-edematous without discharge, post-pharynx non erythematous. ?Neck: Supple without lymphadenopathy. ?Lungs: Clear to auscultation without wheezing, rhonchi or rales. {no increased work of breathing. ?CV: Normal S1, S2 without murmurs. ?Abdomen: Nondistended, nontender. ?Skin: Warm and dry, without lesions or rashes. ?Extremities:  No clubbing, cyanosis or edema. ?Neuro:   Grossly intact. ? ?Diagnositics/Labs: ?None today ? ?Assessment and plan: ?  ?Allergic rhinitis with conjunctivitis ?   - continue avoidance measures for grasses, weeds, trees, dust mites, molds, cockroach ?   - continue allergen immunotherapy per protocol.  Will get you back up to maintenance dosing on your allergy shots ?   -Use Allegra 180mg  daily as needed for now.  Alternate every 6 months or so between this and Xyzal to ensure efficacy.   See if you still need daily antihistamine dosing at this time.    ?   -Continues nasal saline rinses periodically during the week ?   -nasal spray as below ?   -  Nasal saline spray to help keep nose moisturized ?   - for itchy/watery/red eyes use over-the-counter Pataday 1 drop each eye daily if needed ? ?Eustachian tube dysfunction ?  -Currently with crackling and popping sensation of the ear ?  -Flonase has not been effective thus will have you try Xhance nasal spray device.  Dosing max is 2 sprays twice a day.  This device allows for deeper deposition of nasal spray into your sinuses and has the best chance of impacting your ear symptoms ? ? ?Follow-up  6-12 months or sooner if needed ? ?I appreciate the opportunity to take part in Damarkus's care. Please do not hesitate to contact me with questions. ? ?Sincerely, ? ? ?Margo Aye, MD ?Allergy/Immunology ?[facility] ? ? ?

## 2021-04-11 ENCOUNTER — Ambulatory Visit (INDEPENDENT_AMBULATORY_CARE_PROVIDER_SITE_OTHER): Payer: BC Managed Care – PPO

## 2021-04-11 DIAGNOSIS — J309 Allergic rhinitis, unspecified: Secondary | ICD-10-CM | POA: Diagnosis not present

## 2021-04-18 ENCOUNTER — Ambulatory Visit (INDEPENDENT_AMBULATORY_CARE_PROVIDER_SITE_OTHER): Payer: BC Managed Care – PPO

## 2021-04-18 DIAGNOSIS — M9903 Segmental and somatic dysfunction of lumbar region: Secondary | ICD-10-CM | POA: Diagnosis not present

## 2021-04-18 DIAGNOSIS — J309 Allergic rhinitis, unspecified: Secondary | ICD-10-CM

## 2021-04-27 ENCOUNTER — Ambulatory Visit (INDEPENDENT_AMBULATORY_CARE_PROVIDER_SITE_OTHER): Payer: BC Managed Care – PPO

## 2021-04-27 ENCOUNTER — Other Ambulatory Visit: Payer: Self-pay | Admitting: *Deleted

## 2021-04-27 ENCOUNTER — Encounter: Payer: Self-pay | Admitting: Allergy

## 2021-04-27 DIAGNOSIS — J309 Allergic rhinitis, unspecified: Secondary | ICD-10-CM | POA: Diagnosis not present

## 2021-04-27 MED ORDER — XHANCE 93 MCG/ACT NA EXHU: 2.0000 | INHALANT_SUSPENSION | Freq: Two times a day (BID) | NASAL | 5 refills | Status: DC

## 2021-05-02 ENCOUNTER — Ambulatory Visit (INDEPENDENT_AMBULATORY_CARE_PROVIDER_SITE_OTHER): Payer: BC Managed Care – PPO | Admitting: *Deleted

## 2021-05-02 DIAGNOSIS — J309 Allergic rhinitis, unspecified: Secondary | ICD-10-CM | POA: Diagnosis not present

## 2021-05-04 DIAGNOSIS — M9903 Segmental and somatic dysfunction of lumbar region: Secondary | ICD-10-CM | POA: Diagnosis not present

## 2021-05-10 ENCOUNTER — Telehealth: Payer: Self-pay | Admitting: Allergy & Immunology

## 2021-05-10 NOTE — Telephone Encounter (Signed)
Pharmacy called to clarify Xhance dosing.  He was written for 2 sprays twice a day.  They wanted to confirm whether it was 2 sprays per nostril or 2 sprays total (ie 1 spray per nostril) twice daily. ? ?I reviewed Dr. Randell Patient note and it looks like she was trying to maximize therapy, so I recommended 2 sprays per nostril twice a day. ? ?Malachi Bonds, MD ?[facility] ? ?

## 2021-05-16 DIAGNOSIS — F902 Attention-deficit hyperactivity disorder, combined type: Secondary | ICD-10-CM | POA: Diagnosis not present

## 2021-05-16 DIAGNOSIS — Z79899 Other long term (current) drug therapy: Secondary | ICD-10-CM | POA: Diagnosis not present

## 2021-05-24 ENCOUNTER — Ambulatory Visit (INDEPENDENT_AMBULATORY_CARE_PROVIDER_SITE_OTHER): Payer: BC Managed Care – PPO

## 2021-05-24 DIAGNOSIS — J309 Allergic rhinitis, unspecified: Secondary | ICD-10-CM

## 2021-05-31 DIAGNOSIS — M9903 Segmental and somatic dysfunction of lumbar region: Secondary | ICD-10-CM | POA: Diagnosis not present

## 2021-06-06 ENCOUNTER — Ambulatory Visit (INDEPENDENT_AMBULATORY_CARE_PROVIDER_SITE_OTHER): Payer: BC Managed Care – PPO

## 2021-06-06 DIAGNOSIS — J309 Allergic rhinitis, unspecified: Secondary | ICD-10-CM

## 2021-06-13 ENCOUNTER — Ambulatory Visit (INDEPENDENT_AMBULATORY_CARE_PROVIDER_SITE_OTHER): Payer: BC Managed Care – PPO

## 2021-06-13 DIAGNOSIS — J309 Allergic rhinitis, unspecified: Secondary | ICD-10-CM | POA: Diagnosis not present

## 2021-06-20 DIAGNOSIS — M9903 Segmental and somatic dysfunction of lumbar region: Secondary | ICD-10-CM | POA: Diagnosis not present

## 2021-06-22 ENCOUNTER — Ambulatory Visit (INDEPENDENT_AMBULATORY_CARE_PROVIDER_SITE_OTHER): Payer: BC Managed Care – PPO

## 2021-06-22 DIAGNOSIS — J309 Allergic rhinitis, unspecified: Secondary | ICD-10-CM

## 2021-07-06 ENCOUNTER — Ambulatory Visit (INDEPENDENT_AMBULATORY_CARE_PROVIDER_SITE_OTHER): Payer: BC Managed Care – PPO

## 2021-07-06 DIAGNOSIS — J309 Allergic rhinitis, unspecified: Secondary | ICD-10-CM

## 2021-07-11 ENCOUNTER — Ambulatory Visit (INDEPENDENT_AMBULATORY_CARE_PROVIDER_SITE_OTHER): Payer: BC Managed Care – PPO

## 2021-07-11 DIAGNOSIS — J309 Allergic rhinitis, unspecified: Secondary | ICD-10-CM

## 2021-07-11 DIAGNOSIS — M9903 Segmental and somatic dysfunction of lumbar region: Secondary | ICD-10-CM | POA: Diagnosis not present

## 2021-07-25 ENCOUNTER — Ambulatory Visit (INDEPENDENT_AMBULATORY_CARE_PROVIDER_SITE_OTHER): Payer: BC Managed Care – PPO

## 2021-07-25 DIAGNOSIS — J309 Allergic rhinitis, unspecified: Secondary | ICD-10-CM

## 2021-07-28 DIAGNOSIS — M9903 Segmental and somatic dysfunction of lumbar region: Secondary | ICD-10-CM | POA: Diagnosis not present

## 2021-08-03 ENCOUNTER — Ambulatory Visit (INDEPENDENT_AMBULATORY_CARE_PROVIDER_SITE_OTHER): Payer: BC Managed Care – PPO

## 2021-08-03 DIAGNOSIS — J309 Allergic rhinitis, unspecified: Secondary | ICD-10-CM

## 2021-08-09 ENCOUNTER — Ambulatory Visit (INDEPENDENT_AMBULATORY_CARE_PROVIDER_SITE_OTHER): Payer: BC Managed Care – PPO

## 2021-08-09 DIAGNOSIS — J309 Allergic rhinitis, unspecified: Secondary | ICD-10-CM

## 2021-08-15 ENCOUNTER — Ambulatory Visit (INDEPENDENT_AMBULATORY_CARE_PROVIDER_SITE_OTHER): Payer: BC Managed Care – PPO

## 2021-08-15 DIAGNOSIS — F902 Attention-deficit hyperactivity disorder, combined type: Secondary | ICD-10-CM | POA: Diagnosis not present

## 2021-08-15 DIAGNOSIS — J309 Allergic rhinitis, unspecified: Secondary | ICD-10-CM

## 2021-08-15 DIAGNOSIS — M9903 Segmental and somatic dysfunction of lumbar region: Secondary | ICD-10-CM | POA: Diagnosis not present

## 2021-08-15 DIAGNOSIS — Z79899 Other long term (current) drug therapy: Secondary | ICD-10-CM | POA: Diagnosis not present

## 2021-08-22 ENCOUNTER — Ambulatory Visit (INDEPENDENT_AMBULATORY_CARE_PROVIDER_SITE_OTHER): Payer: BC Managed Care – PPO

## 2021-08-22 DIAGNOSIS — J309 Allergic rhinitis, unspecified: Secondary | ICD-10-CM

## 2021-08-29 ENCOUNTER — Ambulatory Visit (INDEPENDENT_AMBULATORY_CARE_PROVIDER_SITE_OTHER): Payer: BC Managed Care – PPO

## 2021-08-29 DIAGNOSIS — J309 Allergic rhinitis, unspecified: Secondary | ICD-10-CM

## 2021-09-05 ENCOUNTER — Ambulatory Visit (INDEPENDENT_AMBULATORY_CARE_PROVIDER_SITE_OTHER): Payer: BC Managed Care – PPO

## 2021-09-05 DIAGNOSIS — J309 Allergic rhinitis, unspecified: Secondary | ICD-10-CM | POA: Diagnosis not present

## 2021-09-05 DIAGNOSIS — M9903 Segmental and somatic dysfunction of lumbar region: Secondary | ICD-10-CM | POA: Diagnosis not present

## 2021-09-26 ENCOUNTER — Ambulatory Visit (INDEPENDENT_AMBULATORY_CARE_PROVIDER_SITE_OTHER): Payer: BC Managed Care – PPO

## 2021-09-26 DIAGNOSIS — J309 Allergic rhinitis, unspecified: Secondary | ICD-10-CM | POA: Diagnosis not present

## 2021-09-26 DIAGNOSIS — M9903 Segmental and somatic dysfunction of lumbar region: Secondary | ICD-10-CM | POA: Diagnosis not present

## 2021-10-26 DIAGNOSIS — M9903 Segmental and somatic dysfunction of lumbar region: Secondary | ICD-10-CM | POA: Diagnosis not present

## 2021-10-27 DIAGNOSIS — J3089 Other allergic rhinitis: Secondary | ICD-10-CM | POA: Diagnosis not present

## 2021-10-27 NOTE — Progress Notes (Signed)
VIALS EXP 10-28-22 

## 2021-11-02 NOTE — Patient Instructions (Incomplete)
Allergic rhinitis with conjunctivitis    - continue avoidance measures for grasses, weeds, trees, dust mites, molds, cockroach    - continue allergen immunotherapy per protocol.  Will get you back up to maintenance dosing on your allergy shots    -Use Allegra 180mg  daily as needed for now.  Alternate every 6 months or so between this and Xyzal to ensure efficacy.   See if you still need daily antihistamine dosing at this time.       -Continues nasal saline rinses periodically during the week    -nasal spray as below    -Nasal saline spray to help keep nose moisturized    - for itchy/watery/red eyes use over-the-counter Pataday 1 drop each eye daily if needed  Eustachian tube dysfunction   -Currently with crackling and popping sensation of the ear   -Flonase has not been effective thus will have you try Xhance nasal spray device.  Dosing max is 2 sprays twice a day.  This device allows for deeper deposition of nasal spray into your sinuses and has the best chance of impacting your ear symptoms   Follow-up 6-12 months or sooner if needed

## 2021-11-03 ENCOUNTER — Other Ambulatory Visit: Payer: Self-pay

## 2021-11-03 ENCOUNTER — Encounter: Payer: Self-pay | Admitting: Family

## 2021-11-03 ENCOUNTER — Ambulatory Visit (INDEPENDENT_AMBULATORY_CARE_PROVIDER_SITE_OTHER): Payer: BC Managed Care – PPO | Admitting: Family

## 2021-11-03 VITALS — BP 118/68 | HR 81 | Temp 98.9°F | Resp 17

## 2021-11-03 DIAGNOSIS — H1013 Acute atopic conjunctivitis, bilateral: Secondary | ICD-10-CM | POA: Diagnosis not present

## 2021-11-03 DIAGNOSIS — H6993 Unspecified Eustachian tube disorder, bilateral: Secondary | ICD-10-CM | POA: Diagnosis not present

## 2021-11-03 DIAGNOSIS — J309 Allergic rhinitis, unspecified: Secondary | ICD-10-CM | POA: Diagnosis not present

## 2021-11-03 DIAGNOSIS — J3089 Other allergic rhinitis: Secondary | ICD-10-CM

## 2021-11-03 MED ORDER — EPINEPHRINE 0.3 MG/0.3ML IJ SOAJ: 0.3000 mg | INTRAMUSCULAR | 1 refills | Status: DC | PRN

## 2021-11-03 NOTE — Progress Notes (Signed)
Joshua Wise [id number] Dept: [id number]  FOLLOW UP NOTE  Patient ID: Joshua Wise, male    DOB: 07-11-1990  Age: 31 y.o. MRN: [id number] Date of Office Visit: 11/03/2021  Assessment  Chief Complaint: Allergic Rhinitis   HPI [name] is a 31 year old male who presents today for follow-up of allergic rhinitis with conjunctivitis and eustachian tube dysfunction.  He was last seen on April 07, 2021 by Dr. Nelva Bush.  He denies any new diagnosis or surgery since his last office visit.  Allergic rhinitis with conjunctivitis: He continues to use [place] as needed and he alternates between [employer] as needed.  He also continues to receive allergy injections and feels like they have helped significantly.  He denies any large local reactions with his allergy injections.  He reports nasal congestion that are some days better than others.  He denies rhinorrhea and postnasal drip.  He has not had any sinus infections since we last saw him.    Eustachian tube dysfunction: He continues to have popping in bilateral ears.  Truett Perna does help some, but the popping never goes away.  He is now only using Xhance as needed because when he used it 1 spray each nostril twice a day it dried him out.  He does mention that Truett Perna works well when it is needed.  He did not ever go back to see ear nose and throat.  He is interested in a referral for ENT due to the popping in his ears.   Drug Allergies:  No Known Allergies  Review of Systems: Review of Systems  Constitutional:  Negative for chills and fever.  HENT:         Reports nasal congestion and times and constant bilateral ear popping. denies rhinorrhea and post nasal drip  Eyes:        Denies itchy watery eyes  Respiratory:  Negative for cough, shortness of breath and wheezing.   Cardiovascular:  Negative for chest pain and palpitations.  Gastrointestinal:        Denies heartburn or reflux symptoms  Genitourinary:  Negative  for frequency.  Skin:  Negative for itching and rash.  Neurological:  Negative for headaches.  Endo/Heme/Allergies:  Positive for environmental allergies.     Physical Exam: BP 118/68   Pulse 81   Temp 98.9 F (37.2 C) (Temporal)   Resp 17   SpO2 97%    Physical Exam Constitutional:      Appearance: Normal appearance.  HENT:     Head: Normocephalic and atraumatic.     Comments: Pharynx normal, eyes normal, ears normal, nose normal    Right Ear: Tympanic membrane, ear canal and external ear normal.     Left Ear: Tympanic membrane, ear canal and external ear normal.     Nose: Nose normal.     Mouth/Throat:     Mouth: Mucous membranes are moist.     Pharynx: Oropharynx is clear.  Eyes:     Conjunctiva/sclera: Conjunctivae normal.  Cardiovascular:     Rate and Rhythm: Normal rate and regular rhythm.     Heart sounds: Normal heart sounds.  Pulmonary:     Effort: Pulmonary effort is normal.     Breath sounds: Normal breath sounds.     Comments: Lungs clear to auscultation Musculoskeletal:     Cervical back: Neck supple.  Skin:    General: Skin is warm.  Neurological:     Mental Status: He is alert and oriented to  person, place, and time.  Psychiatric:        Mood and Affect: Mood normal.        Behavior: Behavior normal.        Thought Content: Thought content normal.        Judgment: Judgment normal.     Diagnostics:  None  Assessment and Plan: 1. Non-seasonal allergic rhinitis due to other allergic trigger   2. Dysfunction of both eustachian tubes   3. Allergic conjunctivitis of both eyes     Meds ordered this encounter  Medications   EPINEPHrine (AUVI-Q) 0.3 mg/0.3 mL IJ SOAJ injection    Sig: Inject 0.3 mg into the muscle as needed for anaphylaxis.    Dispense:  1 each    Refill:  1    [id number] (H)    Patient Instructions  Allergic rhinitis with conjunctivitis    - continue avoidance measures for grasses, weeds, trees, dust mites, molds,  cockroach    - continue allergen immunotherapy per protocol.  Will get you back up to maintenance dosing on your allergy shots    -Use Allegra 180mg  daily as needed for now.  Alternate every 6 months or so between this and Xyzal to ensure efficacy.   See if you still need daily antihistamine dosing at this time.       -Continues nasal saline rinses periodically during the week    -nasal spray as below    -Nasal saline gel to help keep nose moisturized    - for itchy/watery/red eyes use over-the-counter Pataday 1 drop each eye daily if needed  Eustachian tube dysfunction   -Currently with crackling and popping sensation of the ear   -Flonase has not been effective thus will have you try Xhance nasal spray device.  Dosing max is 2 sprays twice a day.  This device allows for deeper deposition of nasal spray into your sinuses and has the best chance of impacting your ear symptoms - We will refer you to ENT    Follow-up 6-12 months or sooner if needed  Return in about 6 months (around 05/05/2022), or if symptoms worsen or fail to improve.    Thank you for the opportunity to care for this patient.  Please do not hesitate to contact me with questions.  07/05/2022, FNP [facility]

## 2021-11-07 DIAGNOSIS — M9903 Segmental and somatic dysfunction of lumbar region: Secondary | ICD-10-CM | POA: Diagnosis not present

## 2021-11-18 DIAGNOSIS — F902 Attention-deficit hyperactivity disorder, combined type: Secondary | ICD-10-CM | POA: Diagnosis not present

## 2021-11-18 DIAGNOSIS — Z79899 Other long term (current) drug therapy: Secondary | ICD-10-CM | POA: Diagnosis not present

## 2021-11-21 ENCOUNTER — Telehealth: Payer: Self-pay

## 2021-11-21 NOTE — Telephone Encounter (Signed)
Referral has been placed to the following office:   [place] Phone: [phone] Fax: [phone] [address], [place] [address]   I called and left a detailed voicemail for patient regarding the referral being placed.

## 2021-11-21 NOTE — Telephone Encounter (Signed)
-----   Message from Althea Charon, [place] sent at 11/03/2021 10:29 AM EDT ----- Please refer to ENT due to popping of bilateral ears

## 2021-11-28 DIAGNOSIS — M9903 Segmental and somatic dysfunction of lumbar region: Secondary | ICD-10-CM | POA: Diagnosis not present

## 2021-12-15 ENCOUNTER — Ambulatory Visit (HOSPITAL_COMMUNITY)
Admission: EM | Admit: 2021-12-15 | Discharge: 2021-12-15 | Disposition: A | Discharge status: Home / Self Care | Payer: BC Managed Care – PPO | Attending: Internal Medicine | Admitting: Internal Medicine

## 2021-12-15 ENCOUNTER — Encounter (HOSPITAL_COMMUNITY): Payer: Self-pay | Admitting: *Deleted

## 2021-12-15 ENCOUNTER — Ambulatory Visit (HOSPITAL_COMMUNITY): Payer: BC Managed Care – PPO

## 2021-12-15 ENCOUNTER — Ambulatory Visit (INDEPENDENT_AMBULATORY_CARE_PROVIDER_SITE_OTHER): Payer: BC Managed Care – PPO

## 2021-12-15 DIAGNOSIS — M62830 Muscle spasm of back: Secondary | ICD-10-CM

## 2021-12-15 DIAGNOSIS — M545 Low back pain, unspecified: Secondary | ICD-10-CM | POA: Diagnosis not present

## 2021-12-15 DIAGNOSIS — S39012A Strain of muscle, fascia and tendon of lower back, initial encounter: Secondary | ICD-10-CM | POA: Diagnosis not present

## 2021-12-15 DIAGNOSIS — M25551 Pain in right hip: Secondary | ICD-10-CM | POA: Diagnosis not present

## 2021-12-15 MED ORDER — ACETAMINOPHEN 325 MG PO TABS
ORAL_TABLET | ORAL | Status: AC
  Filled 2021-12-15: qty 3

## 2021-12-15 MED ORDER — ACETAMINOPHEN 325 MG PO TABS
975.0000 mg | ORAL_TABLET | Freq: Once | ORAL | Status: AC
  Administered 2021-12-15: 975 mg via ORAL

## 2021-12-15 MED ORDER — BACLOFEN 10 MG PO TABS: 10.0000 mg | ORAL_TABLET | Freq: Three times a day (TID) | ORAL | 0 refills | Status: DC

## 2021-12-15 MED ORDER — METHYLPREDNISOLONE SODIUM SUCC 125 MG IJ SOLR
80.0000 mg | Freq: Once | INTRAMUSCULAR | Status: AC
  Administered 2021-12-15: 80 mg via INTRAMUSCULAR

## 2021-12-15 MED ORDER — METHYLPREDNISOLONE SODIUM SUCC 125 MG IJ SOLR
INTRAMUSCULAR | Status: AC
  Filled 2021-12-15: qty 2

## 2021-12-15 NOTE — Discharge Instructions (Addendum)
You have been evaluated in the today for your back pain. Your pain is most likely muscle strain which will improve on its own with time.   Take ibuprofen 800mg  with food every 8 hours for the next 2-3 days consistently to treat pain and inflammation, then as needed. Take this medication with a snack to avoid stomach upset.   Your first dose of ibuprofen when you get home may be tomorrow morning since you were given solumedrol injection (steroid) in the clinic today.  You may also take baclofen muscle relaxer every 8 hours as needed for muscle spasm.  Do not take this medication and drive or drink alcohol as it can make you sleepy.  Mainly use this medicine at nighttime as needed.  Apply heat and perform gentle range of motion exercises to the area of greatest pain to prevent muscle stiffness and provide further pain relief.   Follow-up with your primary care provider or return to urgent care if your symptoms do not improve in the next 3 to 4 days with medications and interventions recommended today.  If you develop any new or worsening symptoms, please return to urgent care.  If your symptoms are severe, please go to the emergency room.  I hope you feel better!

## 2021-12-15 NOTE — ED Triage Notes (Signed)
Pt states he was at the gym today and had a muscle spasms and not he can't move and he is in a lot of pain. The pain radiates to his lower abd and hip mostly on the right. He did take IBU 800mg  at 11:50 and it helped a lot, he used icy hot rub and ice to get some of the pain to stop so he could come to the visit.

## 2021-12-15 NOTE — ED Provider Notes (Signed)
[facility]    CSN: [id number] Arrival date & time: 12/15/21  1309      History   Chief Complaint Chief Complaint  Patient presents with   Back Pain    HPI Joshua Wise is a 31 y.o. male.   Patient presents urgent care for valuation of left sided lower back pain that started acutely today.  He was working out at the gym performing exercises where he was bending down to pick up a 30 pound weight then standing up and lifting the weight above his head with his left arm repeatedly.  He states he began to feel some twinges to the left lower back while he was working out and the pain has become significantly worse over the last few hours.  Patient took 800 mg of ibuprofen and applied IcyHot to be able to become comfortable enough to drive himself to urgent care.  Pain is mostly to the left lumbar spine and sometimes radiates to the left hip.  He also complains of pain to the right hip that radiates to the right side of the abdomen.  Movement of any type makes the pain worse.  He is seated in a somewhat comfortable position in a wheelchair but states that he is hesitant to move as this will cause significant pain.  Denies recent known injuries, falls, or trauma to the area of pain. Ibuprofen dose was taken at 11:50am (about 3.5 hours ago) prior to arrival at urgent care.  Denies chest pain, shortness of breath, leg swelling, headache, fever/chills, neck pain, numbness and tingling to the bilateral lower upper extremities, saddle anesthesia symptoms, and urinary symptoms.  Denies constipation, diarrhea, nausea, vomiting and dizziness.  This has never happened in the past.   Back Pain   Past Medical History:  Diagnosis Date   Eczema    Hypertension     Patient Active Problem List   Diagnosis Date Noted   Allergic rhinitis 09/17/2019   Allergic conjunctivitis of both eyes 09/17/2019   Chronic migraine without aura without status migrainosus, not intractable 02/14/2017    Essential hypertension, benign 02/05/2017    Past Surgical History:  Procedure Laterality Date   None         Home Medications    Prior to Admission medications   Medication Sig Start Date End Date Taking? Authorizing Provider  EPINEPHrine (AUVI-Q) 0.3 mg/0.3 mL IJ SOAJ injection Inject 0.3 mg into the muscle as needed for anaphylaxis. 11/03/21  Yes Nehemiah Settleale, Christine, FNP  guanFACINE (INTUNIV) 1 MG TB24 ER tablet Take 1 mg by mouth daily. 02/03/19  Yes [provider]  Timmothy SoursXHANCE 93 MCG/ACT EXHU Place 2 sprays into the nose in the morning and at bedtime. 04/27/21  Yes Padgett, Pilar GrammesShaylar Patricia, MD  amphetamine-dextroamphetamine (ADDERALL XR) 5 MG 24 hr capsule Take 5 mg by mouth daily. 05/10/21   [provider]  baclofen (LIORESAL) 10 MG tablet Take 1 tablet (10 mg total) by mouth 3 (three) times daily. 12/15/21   Carlisle BeersStanhope, Aubrei Bouchie M, FNP  fluticasone (FLONASE) 50 MCG/ACT nasal spray Place 2 sprays into both nostrils daily as needed for allergies or rhinitis. Patient not taking: Reported on 04/07/2021    [provider]  levocetirizine (XYZAL) 5 MG tablet Take 5 mg by mouth as needed.  Patient not taking: Reported on 11/03/2021    [provider]    Family History Family History  Problem Relation Age of Onset   Healthy Mother    Hypertension Father  Migraines Father    Cancer Paternal Grandmother        melanoma and breast cancer   Cancer Paternal Grandfather        bladder and colon cancer    Social History Social History   Tobacco Use   Smoking status: Never   Smokeless tobacco: Never  Vaping Use   Vaping Use: Never used  Substance Use Topics   Alcohol use: Yes    Comment: occasional, 1-2 nights per week (4-6 wine/beer)    Drug use: No     Allergies   Patient has no known allergies.   Review of Systems Review of Systems  Musculoskeletal:  Positive for back pain.  Per HPI   Physical Exam Triage Vital Signs ED Triage Vitals   Enc Vitals Group     BP 12/15/21 1435 (!) 126/106     Pulse Rate 12/15/21 1435 85     Resp 12/15/21 1435 18     Temp 12/15/21 1435 98.7 F (37.1 C)     Temp Source 12/15/21 1435 Oral     SpO2 12/15/21 1435 100 %     Weight --      Height --      Head Circumference --      Peak Flow --      Pain Score 12/15/21 1432 10     Pain Loc --      Pain Edu? --      Excl. in GC? --    No data found.  Updated Vital Signs BP (!) 126/106 (BP Location: Right Arm)   Pulse 85   Temp 98.7 F (37.1 C) (Oral)   Resp 18   SpO2 100%   Visual Acuity Right Eye Distance:   Left Eye Distance:   Bilateral Distance:    Right Eye Near:   Left Eye Near:    Bilateral Near:     Physical Exam Vitals and nursing note reviewed.  Constitutional:      Appearance: He is not ill-appearing or toxic-appearing.     Comments: Patient appears to be very uncomfortable seated in a wheelchair in the exam room.  He is leaning to the right side and is hesitant to allow examiner to help him move due to extreme pain.  HENT:     Head: Normocephalic and atraumatic.     Right Ear: Hearing and external ear normal.     Left Ear: Hearing and external ear normal.     Nose: Nose normal.     Mouth/Throat:     Lips: Pink.  Eyes:     General: Lids are normal. Vision grossly intact. Gaze aligned appropriately.     Extraocular Movements: Extraocular movements intact.     Conjunctiva/sclera: Conjunctivae normal.  Cardiovascular:     Rate and Rhythm: Normal rate and regular rhythm.     Heart sounds: Normal heart sounds, S1 normal and S2 normal.  Pulmonary:     Effort: Pulmonary effort is normal. No respiratory distress.     Breath sounds: Normal breath sounds and air entry.  Musculoskeletal:     Cervical back: Normal and neck supple.     Thoracic back: Normal.     Lumbar back: Tenderness present. No swelling, edema, deformity, signs of trauma, lacerations, spasms or bony tenderness. Decreased range of motion.      Comments: Difficult to complete full exam due to patient's limited mobility in the wheelchair. Patient is able to lift right leg up off of the wheelchair, but  is unwilling to lift up the left leg due to significant tenderness. Neurovascularly intact to bilateral lower extremities. 5/5 strength to the bilateral upper and lower extremities (dorsiflexion and plantar flexion lower/grips bilaterally upper). No visible muscle spasms to the area of greatest tenderness. No midline cervical, thoracic, or lumbar sinal pain.  Skin:    General: Skin is warm and dry.     Capillary Refill: Capillary refill takes less than 2 seconds.     Findings: No rash.  Neurological:     General: No focal deficit present.     Mental Status: He is alert and oriented to person, place, and time. Mental status is at baseline.     Cranial Nerves: No dysarthria or facial asymmetry.  Psychiatric:        Mood and Affect: Mood normal.        Speech: Speech normal.        Behavior: Behavior normal.        Thought Content: Thought content normal.        Judgment: Judgment normal.      UC Treatments / Results  Labs (all labs ordered are listed, but only abnormal results are displayed) Labs Reviewed - No data to display  EKG   Radiology DG Lumbar Spine 2-3 Views  Result Date: 12/15/2021 CLINICAL DATA:  Back pain, muscle spasm, right hip pain EXAM: LUMBAR SPINE - 2-3 VIEW COMPARISON:  None Available. FINDINGS: Frontal and lateral views of the lumbar spine are obtained. There are 5 non-rib-bearing lumbar type vertebral bodies identified. The patient is listing toward the right. Otherwise alignment of the lumbar spine is anatomic. There are no acute fractures. Disc spaces are well preserved. Sacroiliac joints are unremarkable. IMPRESSION: 1. No acute bony abnormality. Electronically Signed   By: Sharlet Salina M.D.   On: 12/15/2021 16:34    Procedures Procedures (including critical care time)  Medications Ordered in  UC Medications  acetaminophen (TYLENOL) tablet 975 mg (975 mg Oral Given 12/15/21 1523)  methylPREDNISolone sodium succinate (SOLU-MEDROL) 125 mg/2 mL injection 80 mg (80 mg Intramuscular Given 12/15/21 1529)    Initial Impression / Assessment and Plan / UC Course  I have reviewed the triage vital signs and the nursing notes.  Pertinent labs & imaging results that were available during my care of the patient were reviewed by me and considered in my medical decision making (see chart for details).   1. Muscle spasm of back, strain of lumbar region Presentation is consistent with acute muscle strain of the lumbar spine that will likely resolve with rest, fluids, as needed use of NSAID and muscle relaxer, heat, and gentle range of motion exercises. Patient given solumedrol 80mg  IM due to significant discomfort in clinic and states that this is helped to improve the pain to the point where he is able to be comfortable in a seated position in the wheelchair.  Imaging of lumbar spine is unremarkable for acute bony abnormality.  May take ibuprofen 800mg  every 8 hours and baclofen muscle relaxer every 8 hours as needed for muscle spasm. Drowsiness precautions regarding muscle relaxer use discussed. Heat and gentle ROM exercises discussed.   Discussed physical exam and available lab work findings in clinic with patient.  Counseled patient regarding appropriate use of medications and potential side effects for all medications recommended or prescribed today. Discussed red flag signs and symptoms of worsening condition,when to call the PCP office, return to urgent care, and when to seek higher level of care in the  emergency department. Patient verbalizes understanding and agreement with plan. All questions answered. Patient discharged in stable condition.   Final Clinical Impressions(s) / UC Diagnoses   Final diagnoses:  Muscle spasm of back  Strain of lumbar region, initial encounter     Discharge  Instructions      You have been evaluated in the today for your back pain. Your pain is most likely muscle strain which will improve on its own with time.   Take ibuprofen 800mg  with food every 8 hours for the next 2-3 days consistently to treat pain and inflammation, then as needed. Take this medication with a snack to avoid stomach upset.   Your first dose of ibuprofen when you get home may be tomorrow morning since you were given solumedrol injection (steroid) in the clinic today.  You may also take baclofen muscle relaxer every 8 hours as needed for muscle spasm.  Do not take this medication and drive or drink alcohol as it can make you sleepy.  Mainly use this medicine at nighttime as needed.  Apply heat and perform gentle range of motion exercises to the area of greatest pain to prevent muscle stiffness and provide further pain relief.   Follow-up with your primary care provider or return to urgent care if your symptoms do not improve in the next 3 to 4 days with medications and interventions recommended today.  If you develop any new or worsening symptoms, please return to urgent care.  If your symptoms are severe, please go to the emergency room.  I hope you feel better!    ED Prescriptions     Medication Sig Dispense Auth. Provider   baclofen (LIORESAL) 10 MG tablet  (Status: Discontinued) Take 1 tablet (10 mg total) by mouth 3 (three) times daily. 30 each , FNP   baclofen (LIORESAL) 10 MG tablet Take 1 tablet (10 mg total) by mouth 3 (three) times daily. 30 each Carlisle Beers, FNP      PDMP not reviewed this encounter.   Carlisle Beers, Carlisle Beers 12/15/21 1713

## 2021-12-20 DIAGNOSIS — M9903 Segmental and somatic dysfunction of lumbar region: Secondary | ICD-10-CM | POA: Diagnosis not present

## 2022-01-03 DIAGNOSIS — M9903 Segmental and somatic dysfunction of lumbar region: Secondary | ICD-10-CM | POA: Diagnosis not present

## 2022-01-18 DIAGNOSIS — M9903 Segmental and somatic dysfunction of lumbar region: Secondary | ICD-10-CM | POA: Diagnosis not present

## 2022-02-06 DIAGNOSIS — M9903 Segmental and somatic dysfunction of lumbar region: Secondary | ICD-10-CM | POA: Diagnosis not present

## 2022-02-27 DIAGNOSIS — M9903 Segmental and somatic dysfunction of lumbar region: Secondary | ICD-10-CM | POA: Diagnosis not present

## 2022-03-03 DIAGNOSIS — F411 Generalized anxiety disorder: Secondary | ICD-10-CM | POA: Diagnosis not present

## 2022-03-10 DIAGNOSIS — F411 Generalized anxiety disorder: Secondary | ICD-10-CM | POA: Diagnosis not present

## 2022-03-13 DIAGNOSIS — M9903 Segmental and somatic dysfunction of lumbar region: Secondary | ICD-10-CM | POA: Diagnosis not present

## 2022-03-21 DIAGNOSIS — F411 Generalized anxiety disorder: Secondary | ICD-10-CM | POA: Diagnosis not present

## 2022-03-27 DIAGNOSIS — M9903 Segmental and somatic dysfunction of lumbar region: Secondary | ICD-10-CM | POA: Diagnosis not present

## 2022-03-31 DIAGNOSIS — F411 Generalized anxiety disorder: Secondary | ICD-10-CM | POA: Diagnosis not present

## 2022-04-17 DIAGNOSIS — M9903 Segmental and somatic dysfunction of lumbar region: Secondary | ICD-10-CM | POA: Diagnosis not present

## 2022-04-19 DIAGNOSIS — F411 Generalized anxiety disorder: Secondary | ICD-10-CM | POA: Diagnosis not present

## 2022-04-26 DIAGNOSIS — M9903 Segmental and somatic dysfunction of lumbar region: Secondary | ICD-10-CM | POA: Diagnosis not present

## 2022-05-01 ENCOUNTER — Ambulatory Visit: Payer: BC Managed Care – PPO | Admitting: Family

## 2022-05-01 DIAGNOSIS — J309 Allergic rhinitis, unspecified: Secondary | ICD-10-CM

## 2022-05-04 DIAGNOSIS — F411 Generalized anxiety disorder: Secondary | ICD-10-CM | POA: Diagnosis not present

## 2022-05-15 DIAGNOSIS — M9903 Segmental and somatic dysfunction of lumbar region: Secondary | ICD-10-CM | POA: Diagnosis not present

## 2022-05-19 DIAGNOSIS — F411 Generalized anxiety disorder: Secondary | ICD-10-CM | POA: Diagnosis not present

## 2022-05-25 ENCOUNTER — Ambulatory Visit (INDEPENDENT_AMBULATORY_CARE_PROVIDER_SITE_OTHER): Payer: BC Managed Care – PPO | Admitting: Family Medicine

## 2022-05-25 ENCOUNTER — Encounter (HOSPITAL_BASED_OUTPATIENT_CLINIC_OR_DEPARTMENT_OTHER): Payer: Self-pay | Admitting: Family Medicine

## 2022-05-25 VITALS — BP 145/95 | HR 76 | Temp 97.6°F | Ht 73.0 in | Wt 181.4 lb

## 2022-05-25 DIAGNOSIS — Z0000 Encounter for general adult medical examination without abnormal findings: Secondary | ICD-10-CM

## 2022-05-25 DIAGNOSIS — G43909 Migraine, unspecified, not intractable, without status migrainosus: Secondary | ICD-10-CM

## 2022-05-25 DIAGNOSIS — G43709 Chronic migraine without aura, not intractable, without status migrainosus: Secondary | ICD-10-CM | POA: Diagnosis not present

## 2022-05-25 DIAGNOSIS — Z5181 Encounter for therapeutic drug level monitoring: Secondary | ICD-10-CM | POA: Diagnosis not present

## 2022-05-25 DIAGNOSIS — I10 Essential (primary) hypertension: Secondary | ICD-10-CM | POA: Diagnosis not present

## 2022-05-25 DIAGNOSIS — F901 Attention-deficit hyperactivity disorder, predominantly hyperactive type: Secondary | ICD-10-CM | POA: Diagnosis not present

## 2022-05-25 MED ORDER — GUANFACINE HCL 1 MG PO TABS: 1.0000 mg | ORAL_TABLET | Freq: Every day | ORAL | 1 refills | Status: DC

## 2022-05-25 MED ORDER — UBROGEPANT 50 MG PO TABS: 50.0000 mg | ORAL_TABLET | Freq: Once | ORAL | 0 refills | Status: DC | PRN

## 2022-05-25 NOTE — Progress Notes (Signed)
New Patient Office Visit  Subjective    Patient ID: Joshua Wise, male    DOB: [birth year]  Age: 32 y.o. MRN: [id number]  CC:  Chief Complaint  Patient presents with   Establish Care    Pt here to establish new care   HPI Joshua Wise is a 32 yo male patient is presents to establish care. Review past medical history, surgical and social history.   Former PCP: about 5 years, [place]   History of allergic rhinitis: did allergy shots for a while  PMH: hypertension, ADHD (started on guanfacine in past to also help with ADHD symptoms) BP home readings: anywhere from 130s/70s; 140/80s;150/90s When BP gets higher, he feels a little more anxious and needs to take some deep breaths. Reports his stress level is higher now with a set of twins and toddler at home. Denies chest pain, palpitations, changes in vision, shortness of breath, lower extremity edema, lightheadedness, weakness, cough.    Reports occasional migraines. Diagnosed about 5-7 years ago, saw specialist. Notices headaches more when he does not get proper sleep and has an increase in stress level.  Reports with migraines he experiences nausea, fatigue, sensitivity to light and sound. Denies aura.  Takes OTC Advil for headaches.   Last 4-6 months, difficult lifestyle change-- set of twins and a toddler. Difficult at this time to focus on self-care with healthy eating habits, management of stress, and daily exercise.    Outpatient Encounter Medications as of 05/25/2022  Medication Sig   fluticasone (FLONASE) 50 MCG/ACT nasal spray Place 2 sprays into both nostrils daily as needed for allergies or rhinitis.   guanFACINE (TENEX) 1 MG tablet Take 1 tablet (1 mg total) by mouth at bedtime.   Ubrogepant 50 MG TABS Take 1 tablet (50 mg total) by mouth once as needed for up to 1 dose (take 1 tablet ( ) at onset of migraine; may repeat dose after =2 hours).   [DISCONTINUED] amphetamine-dextroamphetamine (ADDERALL XR) 5 MG  24 hr capsule Take 5 mg by mouth daily. (Patient not taking: Reported on 05/25/2022)   [DISCONTINUED] baclofen (LIORESAL) 10 MG tablet Take 1 tablet (10 mg total) by mouth 3 (three) times daily. (Patient not taking: Reported on 05/25/2022)   [DISCONTINUED] EPINEPHrine (AUVI-Q) 0.3 mg/0.3 mL IJ SOAJ injection Inject 0.3 mg into the muscle as needed for anaphylaxis. (Patient not taking: Reported on 05/25/2022)   [DISCONTINUED] guanFACINE (INTUNIV) 1 MG TB24 ER tablet Take 1 mg by mouth daily. (Patient not taking: Reported on 05/25/2022)   [DISCONTINUED] levocetirizine (XYZAL) 5 MG tablet Take 5 mg by mouth as needed.  (Patient not taking: Reported on 05/25/2022)   [DISCONTINUED] XHANCE 93 MCG/ACT EXHU Place 2 sprays into the nose in the morning and at bedtime. (Patient not taking: Reported on 05/25/2022)   No facility-administered encounter medications on file as of 05/25/2022.   Past Medical History:  Diagnosis Date   Eczema    Hypertension    Past Surgical History:  Procedure Laterality Date   None     Family History  Problem Relation Age of Onset   Healthy Mother    Hypertension Father    Migraines Father    Cancer Paternal Grandmother        melanoma and breast cancer   Cancer Paternal Grandfather        bladder and colon cancer    Social History   Socioeconomic History   Marital status: Married    Spouse name: Not on file  Number of children: 0   Years of education: 14   Highest education level: Not on file  Occupational History   Occupation: sales  Tobacco Use   Smoking status: Never   Smokeless tobacco: Never  Vaping Use   Vaping Use: Never used  Substance and Sexual Activity   Alcohol use: Yes    Comment: occasional, 1-2 nights per week (4-6 wine/beer)    Drug use: No   Sexual activity: Yes    Birth control/protection: None  Other Topics Concern   Not on file  Social History Narrative   Lives with wife in a one story home with a basement.  No children.     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    Social Determinants of Health   Financial Resource Strain: Not on file  Food Insecurity: Not on file  Transportation Needs: Not on file  Physical Activity: Not on file  Stress: Not on file  Social Connections: Not on file  Intimate Partner Violence: Not on file    Review of Systems  Constitutional:  Negative for malaise/fatigue and weight loss.  HENT:  Negative for hearing loss and tinnitus.   Eyes:  Negative for blurred vision.  Respiratory:  Negative for cough and shortness of breath.   Cardiovascular:  Negative for chest pain and palpitations.  Gastrointestinal:  Negative for abdominal pain, nausea and vomiting.  Musculoskeletal:  Negative for myalgias.  Neurological:  Negative for dizziness, weakness and headaches.  Psychiatric/Behavioral:  Negative for depression and suicidal ideas. The patient is not nervous/anxious and does not have insomnia.         Objective    BP (!) 145/95   Pulse 76   Temp 97.6 F (36.4 C) (Oral)   Ht 6\' 1"  (1.854 m)   Wt 181 lb 6.4 oz (82.3 kg)   SpO2 99%   BMI 23.93 kg/m   Physical Exam Constitutional:      Appearance: Normal appearance.  Cardiovascular:     Rate and Rhythm: Normal rate and regular rhythm.     Pulses: Normal pulses.     Heart sounds: Normal heart sounds.  Pulmonary:     Effort: Pulmonary effort is normal.     Breath sounds: Normal breath sounds.  Neurological:     Mental Status: He is alert.  Psychiatric:        Mood and Affect: Mood normal.        Behavior: Behavior normal.        Thought Content: Thought content normal.        Judgment: Judgment normal.      Assessment & Plan:  1. Migraine without status migrainosus, not intractable, unspecified migraine type Patient reports history of migraines. Patient reports that he is not experiencing a headache today. He would not like to be on daily medication for migraine prevention. Discussed options,  agreed to abortive migraine management. Due to elevated blood pressure, will avoid triptan pharmacologic category. Prescribed ubrogepant Bernita Raisin) and advised him to take 1 tablet (50mg ) at onset of migraine. If no improvement, can take another tablet in 1-2 hours. Denies aura with migraines. Reports nausea but does not want anti-emetic medication at this time. Educated patient about red flag for headaches, including increase in pain intensity with position changes (especially bending over), sudden onset, "worst" headache of your life, change in vision, and focal neurological symptoms. Plan to follow-up in 4 weeks to see if he has experienced a migraine  and had improvement with medication.  - Ubrogepant 50 MG TABS; Take 1 tablet (50 mg total) by mouth once as needed for up to 1 dose (take 1 tablet ( ) at onset of migraine; may repeat dose after 1-2 hours).  Dispense: 30 tablet; Refill: 0  2. Primary hypertension Patient reports history of hypertension. Blood pressure elevated today in office with two readings. Reports adequate control of blood pressure on guanfacine (also to help with ADHD symptoms)  daily. Patient well-appearing and in no acute distress during visit. Cardiovascular exam with heart regular rate and rhythm. Normal heart sounds, no murmurs present. No lower extremity edema present. Lungs clear to auscultation bilaterally. Educated about red flags with high blood pressure and when to seek emergency care. Advised him to continue monitoring blood pressure at home and record readings for follow-up visits. Plan to reassess medication regimen in 4 weeks.  - guanFACINE (TENEX) 1 MG tablet; Take 1 tablet (1 mg total) by mouth at bedtime.  Dispense: 90 tablet; Refill: 1  3. Wellness examination Plan to complete physical in near future. Will advise with results. Patient would like to complete annual physical labs at this time.  - CBC with Differential/Platelet - Comprehensive metabolic panel -  Hemoglobin A1c - Lipid panel - TSH Rfx on Abnormal to Free T4   Return in about 4 weeks (around 06/22/2022) for HTN follow-up.   Alyson Reedy, FNP

## 2022-05-26 LAB — COMPREHENSIVE METABOLIC PANEL
ALT: 32 IU/L (ref 0–44)
AST: 23 IU/L (ref 0–40)
Albumin/Globulin Ratio: 1.8 (ref 1.2–2.2)
Albumin: 4.6 g/dL (ref 4.1–5.1)
Alkaline Phosphatase: 66 IU/L (ref 44–121)
BUN/Creatinine Ratio: 13 (ref 9–20)
BUN: 13 mg/dL (ref 6–20)
Bilirubin Total: 0.4 mg/dL (ref 0.0–1.2)
CO2: 21 mmol/L (ref 20–29)
Calcium: 9.8 mg/dL (ref 8.7–10.2)
Chloride: 102 mmol/L (ref 96–106)
Creatinine, Ser: 1.01 mg/dL (ref 0.76–1.27)
Globulin, Total: 2.5 g/dL (ref 1.5–4.5)
Glucose: 79 mg/dL (ref 70–99)
Potassium: 5 mmol/L (ref 3.5–5.2)
Sodium: 142 mmol/L (ref 134–144)
Total Protein: 7.1 g/dL (ref 6.0–8.5)
eGFR: 101 mL/min/{1.73_m2} (ref >59)

## 2022-05-26 LAB — CBC WITH DIFFERENTIAL/PLATELET
Basophils Absolute: 0 10*3/uL (ref 0.0–0.2)
Basos: 1 %
EOS (ABSOLUTE): 0.1 10*3/uL (ref 0.0–0.4)
Eos: 2 %
Hematocrit: 43 % (ref 37.5–51.0)
Hemoglobin: 15 g/dL (ref 13.0–17.7)
Immature Grans (Abs): 0 10*3/uL (ref 0.0–0.1)
Immature Granulocytes: 0 %
Lymphocytes Absolute: 1.3 10*3/uL (ref 0.7–3.1)
Lymphs: 23 %
MCH: 31.2 pg (ref 26.6–33.0)
MCHC: 34.9 g/dL (ref 31.5–35.7)
MCV: 89 fL (ref 79–97)
Monocytes Absolute: 0.4 10*3/uL (ref 0.1–0.9)
Monocytes: 7 %
Neutrophils Absolute: 3.9 10*3/uL (ref 1.4–7.0)
Neutrophils: 67 %
Platelets: 293 10*3/uL (ref 150–450)
RBC: 4.81 x10E6/uL (ref 4.14–5.80)
RDW: 13.1 % (ref 11.6–15.4)
WBC: 5.8 10*3/uL (ref 3.4–10.8)

## 2022-05-26 LAB — TSH RFX ON ABNORMAL TO FREE T4: TSH: 1.1 u[IU]/mL (ref 0.450–4.500)

## 2022-05-26 LAB — HEMOGLOBIN A1C
Est. average glucose Bld gHb Est-mCnc: 91 mg/dL
Hgb A1c MFr Bld: 4.8 % (ref 4.8–5.6)

## 2022-05-26 LAB — LIPID PANEL
Chol/HDL Ratio: 4.2 ratio (ref 0.0–5.0)
Cholesterol, Total: 140 mg/dL (ref 100–199)
HDL: 33 mg/dL — ABNORMAL LOW (ref >39)
LDL Chol Calc (NIH): 63 mg/dL (ref 0–99)
Triglycerides: 272 mg/dL — ABNORMAL HIGH (ref 0–149)
VLDL Cholesterol Cal: 44 mg/dL — ABNORMAL HIGH (ref 5–40)

## 2022-05-29 DIAGNOSIS — M9903 Segmental and somatic dysfunction of lumbar region: Secondary | ICD-10-CM | POA: Diagnosis not present

## 2022-05-30 DIAGNOSIS — F411 Generalized anxiety disorder: Secondary | ICD-10-CM | POA: Diagnosis not present

## 2022-06-12 DIAGNOSIS — M9903 Segmental and somatic dysfunction of lumbar region: Secondary | ICD-10-CM | POA: Diagnosis not present

## 2022-06-13 ENCOUNTER — Ambulatory Visit (INDEPENDENT_AMBULATORY_CARE_PROVIDER_SITE_OTHER): Payer: BC Managed Care – PPO | Admitting: Family Medicine

## 2022-06-13 ENCOUNTER — Encounter (HOSPITAL_BASED_OUTPATIENT_CLINIC_OR_DEPARTMENT_OTHER): Payer: Self-pay | Admitting: Family Medicine

## 2022-06-13 VITALS — BP 134/86 | HR 69 | Ht 73.0 in | Wt 185.0 lb

## 2022-06-13 DIAGNOSIS — I10 Essential (primary) hypertension: Secondary | ICD-10-CM

## 2022-06-13 NOTE — Progress Notes (Signed)
   Established Patient Office Visit  Subjective   Patient ID: Joshua Wise, male    DOB: [birth year]  Age: 32 y.o. MRN: [id number]  Chief Complaint  Patient presents with   Hypertension    Last visit 05/25/2022 started on guanfacine 1mg  daily  C/o dry mouth  Has felt a lot better than before- no longer feeling his BP increase  Has been checking his BP a lot less because he does not feel it increasing Denies chest pain, palpitations, changes in vision, shortness of breath, lower extremity edema, lightheadedness, weakness, cough.    Review of Systems  Constitutional:  Negative for malaise/fatigue.  Eyes:  Negative for blurred vision.  Respiratory:  Negative for cough and shortness of breath.   Cardiovascular:  Negative for chest pain and palpitations.  Gastrointestinal:  Negative for abdominal pain.  Musculoskeletal:  Negative for myalgias.  Neurological:  Negative for dizziness, weakness and headaches.  Psychiatric/Behavioral:  Negative for depression, substance abuse and suicidal ideas. The patient is not nervous/anxious and does not have insomnia.     Objective:     BP 134/86   Pulse 69   Ht 6\' 1"  (1.854 m)   Wt 185 lb (83.9 kg)   SpO2 100%   BMI 24.41 kg/m  BP Readings from Last 3 Encounters:  06/13/22 134/86  05/25/22 (!) 145/95  12/15/21 (!) 126/106     Physical Exam Constitutional:      Appearance: Normal appearance.  Cardiovascular:     Rate and Rhythm: Normal rate and regular rhythm.     Pulses: Normal pulses.     Heart sounds: Normal heart sounds.  Pulmonary:     Effort: Pulmonary effort is normal.     Breath sounds: Normal breath sounds.  Neurological:     Mental Status: He is alert.  Psychiatric:        Mood and Affect: Mood normal.        Behavior: Behavior normal.        Thought Content: Thought content normal.        Judgment: Judgment normal.      Assessment & Plan:  1. Essential hypertension, benign Patient presents today with  well-controlled blood pressure. Patient in no acute distress and is well-appearing. Cardiovascular exam with heart regular rate and rhythm. Normal heart sounds, no murmurs present. No lower extremity edema present. Lungs clear to auscultation bilaterally. Patient is currently taking guanfacine 1mg  daily. No refills needed today. Advised patient to continue to monitor blood pressure at home occasionally. Return to office sooner if blood pressure begins to increase greater than 130/80. Follow-up in 3 months.     Return in about 3 months (around 09/13/2022) for HTN follow-up.    Alyson Reedy, FNP

## 2022-06-16 DIAGNOSIS — F411 Generalized anxiety disorder: Secondary | ICD-10-CM | POA: Diagnosis not present

## 2022-06-27 DIAGNOSIS — F411 Generalized anxiety disorder: Secondary | ICD-10-CM | POA: Diagnosis not present

## 2022-06-29 DIAGNOSIS — N401 Enlarged prostate with lower urinary tract symptoms: Secondary | ICD-10-CM | POA: Diagnosis not present

## 2022-06-29 DIAGNOSIS — C801 Malignant (primary) neoplasm, unspecified: Secondary | ICD-10-CM | POA: Insufficient documentation

## 2022-06-29 DIAGNOSIS — Z3009 Encounter for other general counseling and advice on contraception: Secondary | ICD-10-CM | POA: Diagnosis not present

## 2022-06-29 DIAGNOSIS — M9903 Segmental and somatic dysfunction of lumbar region: Secondary | ICD-10-CM | POA: Diagnosis not present

## 2022-07-11 DIAGNOSIS — F411 Generalized anxiety disorder: Secondary | ICD-10-CM | POA: Diagnosis not present

## 2022-07-25 DIAGNOSIS — F411 Generalized anxiety disorder: Secondary | ICD-10-CM | POA: Diagnosis not present

## 2022-07-25 DIAGNOSIS — M9903 Segmental and somatic dysfunction of lumbar region: Secondary | ICD-10-CM | POA: Diagnosis not present

## 2022-08-01 DIAGNOSIS — M9903 Segmental and somatic dysfunction of lumbar region: Secondary | ICD-10-CM | POA: Diagnosis not present

## 2022-08-08 DIAGNOSIS — F411 Generalized anxiety disorder: Secondary | ICD-10-CM | POA: Diagnosis not present

## 2022-08-18 DIAGNOSIS — F411 Generalized anxiety disorder: Secondary | ICD-10-CM | POA: Diagnosis not present

## 2022-08-21 DIAGNOSIS — M9903 Segmental and somatic dysfunction of lumbar region: Secondary | ICD-10-CM | POA: Diagnosis not present

## 2022-09-05 DIAGNOSIS — M9903 Segmental and somatic dysfunction of lumbar region: Secondary | ICD-10-CM | POA: Diagnosis not present

## 2022-09-06 DIAGNOSIS — Z302 Encounter for sterilization: Secondary | ICD-10-CM | POA: Diagnosis not present

## 2022-09-13 ENCOUNTER — Ambulatory Visit (HOSPITAL_BASED_OUTPATIENT_CLINIC_OR_DEPARTMENT_OTHER): Payer: BC Managed Care – PPO | Admitting: Family Medicine

## 2022-09-13 NOTE — Progress Notes (Deleted)
   Established Patient Office Visit  Subjective   Patient ID: Joshua Wise, male    DOB: 01/08/1991  Age: 32 y.o. MRN: [id number]  No chief complaint on file.  HYPERTENSION: Joshua Wise is a 32 year-old male who presents for the medical management of hypertension.  Patient's current hypertension medication regimen is: guanfacine 1mg  at bedtime  Patient is currently taking prescribed medications for HTN.  Patient is *** regularly keeping a check on BP at home.  Adhering to low sodium diet: *** Exercising Regularly: *** Denies headache, dizziness, CP, SHOB, vision changes.   BP Readings from Last 3 Encounters:  06/13/22 134/86  05/25/22 (!) 145/95  12/15/21 (!) 126/106     ROS    Objective:     There were no vitals taken for this visit. BP Readings from Last 3 Encounters:  06/13/22 134/86  05/25/22 (!) 145/95  12/15/21 (!) 126/106     Physical Exam    Assessment & Plan:  There are no diagnoses linked to this encounter.   No follow-ups on file.    Alyson Reedy, FNP

## 2022-09-19 DIAGNOSIS — F411 Generalized anxiety disorder: Secondary | ICD-10-CM | POA: Diagnosis not present

## 2022-09-25 DIAGNOSIS — M9903 Segmental and somatic dysfunction of lumbar region: Secondary | ICD-10-CM | POA: Diagnosis not present

## 2022-10-03 DIAGNOSIS — F411 Generalized anxiety disorder: Secondary | ICD-10-CM | POA: Diagnosis not present

## 2022-10-11 DIAGNOSIS — M9903 Segmental and somatic dysfunction of lumbar region: Secondary | ICD-10-CM | POA: Diagnosis not present

## 2022-11-01 DIAGNOSIS — M9903 Segmental and somatic dysfunction of lumbar region: Secondary | ICD-10-CM | POA: Diagnosis not present

## 2022-11-21 DIAGNOSIS — F411 Generalized anxiety disorder: Secondary | ICD-10-CM | POA: Diagnosis not present

## 2022-11-21 DIAGNOSIS — H9193 Unspecified hearing loss, bilateral: Secondary | ICD-10-CM | POA: Diagnosis not present

## 2022-11-22 DIAGNOSIS — M9903 Segmental and somatic dysfunction of lumbar region: Secondary | ICD-10-CM | POA: Diagnosis not present

## 2022-11-28 ENCOUNTER — Other Ambulatory Visit (HOSPITAL_BASED_OUTPATIENT_CLINIC_OR_DEPARTMENT_OTHER): Payer: Self-pay | Admitting: Family Medicine

## 2022-11-28 ENCOUNTER — Other Ambulatory Visit (HOSPITAL_BASED_OUTPATIENT_CLINIC_OR_DEPARTMENT_OTHER): Payer: BC Managed Care – PPO

## 2022-11-28 DIAGNOSIS — Z0000 Encounter for general adult medical examination without abnormal findings: Secondary | ICD-10-CM | POA: Diagnosis not present

## 2022-11-29 DIAGNOSIS — Z302 Encounter for sterilization: Secondary | ICD-10-CM | POA: Diagnosis not present

## 2022-11-29 LAB — COMPREHENSIVE METABOLIC PANEL
ALT: 31 [IU]/L (ref 0–44)
AST: 23 [IU]/L (ref 0–40)
Albumin: 4.6 g/dL (ref 4.1–5.1)
Alkaline Phosphatase: 64 [IU]/L (ref 44–121)
BUN/Creatinine Ratio: 12 (ref 9–20)
BUN: 13 mg/dL (ref 6–20)
Bilirubin Total: 0.6 mg/dL (ref 0.0–1.2)
CO2: 25 mmol/L (ref 20–29)
Calcium: 9.4 mg/dL (ref 8.7–10.2)
Chloride: 101 mmol/L (ref 96–106)
Creatinine, Ser: 1.11 mg/dL (ref 0.76–1.27)
Globulin, Total: 2.4 g/dL (ref 1.5–4.5)
Glucose: 65 mg/dL — ABNORMAL LOW (ref 70–99)
Potassium: 4.3 mmol/L (ref 3.5–5.2)
Sodium: 140 mmol/L (ref 134–144)
Total Protein: 7 g/dL (ref 6.0–8.5)
eGFR: 90 mL/min/{1.73_m2} (ref >59)

## 2022-11-29 LAB — CBC WITH DIFFERENTIAL/PLATELET
Basophils Absolute: 0 10*3/uL (ref 0.0–0.2)
Basos: 1 %
EOS (ABSOLUTE): 0.2 10*3/uL (ref 0.0–0.4)
Eos: 4 %
Hematocrit: 43.4 % (ref 37.5–51.0)
Hemoglobin: 14.6 g/dL (ref 13.0–17.7)
Immature Grans (Abs): 0 10*3/uL (ref 0.0–0.1)
Immature Granulocytes: 0 %
Lymphocytes Absolute: 1.4 10*3/uL (ref 0.7–3.1)
Lymphs: 29 %
MCH: 31.8 pg (ref 26.6–33.0)
MCHC: 33.6 g/dL (ref 31.5–35.7)
MCV: 95 fL (ref 79–97)
Monocytes Absolute: 0.4 10*3/uL (ref 0.1–0.9)
Monocytes: 8 %
Neutrophils Absolute: 2.7 10*3/uL (ref 1.4–7.0)
Neutrophils: 58 %
Platelets: 236 10*3/uL (ref 150–450)
RBC: 4.59 x10E6/uL (ref 4.14–5.80)
RDW: 13.2 % (ref 11.6–15.4)
WBC: 4.6 10*3/uL (ref 3.4–10.8)

## 2022-11-29 LAB — TSH RFX ON ABNORMAL TO FREE T4: TSH: 1.17 u[IU]/mL (ref 0.450–4.500)

## 2022-11-29 LAB — HEMOGLOBIN A1C
Est. average glucose Bld gHb Est-mCnc: 88 mg/dL
Hgb A1c MFr Bld: 4.7 % — ABNORMAL LOW (ref 4.8–5.6)

## 2022-11-29 LAB — LIPID PANEL
Chol/HDL Ratio: 4.1 ratio (ref 0.0–5.0)
Cholesterol, Total: 161 mg/dL (ref 100–199)
HDL: 39 mg/dL — ABNORMAL LOW (ref >39)
LDL Chol Calc (NIH): 91 mg/dL (ref 0–99)
Triglycerides: 182 mg/dL — ABNORMAL HIGH (ref 0–149)
VLDL Cholesterol Cal: 31 mg/dL (ref 5–40)

## 2022-12-04 ENCOUNTER — Other Ambulatory Visit (HOSPITAL_BASED_OUTPATIENT_CLINIC_OR_DEPARTMENT_OTHER): Payer: Self-pay

## 2022-12-04 DIAGNOSIS — I10 Essential (primary) hypertension: Secondary | ICD-10-CM

## 2022-12-04 MED ORDER — GUANFACINE HCL 1 MG PO TABS: 1.0000 mg | ORAL_TABLET | Freq: Every day | ORAL | 1 refills | Status: DC

## 2022-12-05 ENCOUNTER — Ambulatory Visit (INDEPENDENT_AMBULATORY_CARE_PROVIDER_SITE_OTHER): Payer: BC Managed Care – PPO | Admitting: Family Medicine

## 2022-12-05 ENCOUNTER — Encounter (HOSPITAL_BASED_OUTPATIENT_CLINIC_OR_DEPARTMENT_OTHER): Payer: Self-pay | Admitting: Family Medicine

## 2022-12-05 VITALS — BP 123/85 | HR 88 | Ht 73.0 in | Wt 187.0 lb

## 2022-12-05 DIAGNOSIS — H6983 Other specified disorders of Eustachian tube, bilateral: Secondary | ICD-10-CM | POA: Insufficient documentation

## 2022-12-05 DIAGNOSIS — H6993 Unspecified Eustachian tube disorder, bilateral: Secondary | ICD-10-CM | POA: Insufficient documentation

## 2022-12-05 DIAGNOSIS — I10 Essential (primary) hypertension: Secondary | ICD-10-CM | POA: Insufficient documentation

## 2022-12-05 DIAGNOSIS — Z0000 Encounter for general adult medical examination without abnormal findings: Secondary | ICD-10-CM | POA: Insufficient documentation

## 2022-12-05 MED ORDER — GUANFACINE HCL 1 MG PO TABS: 1.0000 mg | ORAL_TABLET | Freq: Every day | ORAL | 3 refills | Status: DC

## 2022-12-05 NOTE — Patient Instructions (Addendum)

## 2022-12-05 NOTE — Progress Notes (Signed)
Complete physical exam  Patient: Joshua Wise   DOB: 1990-07-30   32 y.o. Male  MRN: [id number]  Subjective:    Joshua Wise is a 32 y.o. male who presents today for a complete physical exam. He reports consuming a general diet. Gym/ health club routine includes cardio, mod to heavy weightlifting, and yoga. He tries to work out about 3-5 times per week. He generally feels well. He reports sleeping well. He does not have additional problems to discuss today.   Eustachian tube dysfunction- has seen allergist in the past Reports his ear popping 24/7 over the past few months  He has used Xhance nasal spray in the past with minimal relief   Vision- never been  Dentist- q6 months    Depression screenings:    05/25/2022   11:10 AM 10/10/2018    9:24 AM 07/08/2018    4:17 PM  Depression screen PHQ 2/9  Decreased Interest 0 0 0  Down, Depressed, Hopeless 0 0 0  PHQ - 2 Score 0 0 0  Difficult doing work/chores Not difficult at all      Anxiety screenings:    05/25/2022   11:10 AM  GAD 7 : Generalized Anxiety Score  Nervous, Anxious, on Edge 0  Control/stop worrying 0  Worry too much - different things 0  Trouble relaxing 0  Restless 0  Easily annoyed or irritable 0  Afraid - awful might happen 0  Total GAD 7 Score 0  Anxiety Difficulty Not difficult at all    Patient Care Team: Alyson Reedy, FNP as PCP - General (Family Medicine) Glendale Chard, DO as Consulting Physician (Neurology)   Outpatient Medications Prior to Visit  Medication Sig   [DISCONTINUED] fluticasone (FLONASE) 50 MCG/ACT nasal spray Place 2 sprays into both nostrils daily as needed for allergies or rhinitis. (Patient not taking: Reported on 12/05/2022)   [DISCONTINUED] guanFACINE (TENEX) 1 MG tablet Take 1 tablet (1 mg total) by mouth at bedtime.   [DISCONTINUED] Ubrogepant 50 MG TABS Take 1 tablet (50 mg total) by mouth once as needed for up to 1 dose (take 1 tablet (50mg ) at onset of migraine; may  repeat dose after =2 hours). (Patient not taking: Reported on 12/05/2022)   No facility-administered medications prior to visit.    Review of Systems  Constitutional:  Negative for malaise/fatigue and weight loss.  HENT:  Negative for congestion and sinus pain.   Eyes:  Negative for blurred vision and double vision.  Respiratory:  Negative for cough and shortness of breath.   Cardiovascular:  Negative for chest pain, palpitations and leg swelling.  Gastrointestinal:  Negative for abdominal pain, nausea and vomiting.  Genitourinary:  Negative for frequency and urgency.  Musculoskeletal:  Negative for myalgias.  Neurological:  Negative for dizziness, weakness and headaches.  Psychiatric/Behavioral:  Negative for depression and suicidal ideas. The patient does not have insomnia.      Objective:    BP 123/85   Pulse 88   Ht 6\' 1"  (1.854 m)   Wt 187 lb (84.8 kg)   SpO2 100%   BMI 24.67 kg/m  BP Readings from Last 3 Encounters:  12/05/22 123/85  06/13/22 134/86  05/25/22 (!) 145/95    Physical Exam Vitals reviewed.  Constitutional:      Appearance: Normal appearance.  HENT:     Head: Normocephalic.     Right Ear: Tympanic membrane, ear canal and external ear normal.     Left Ear: Tympanic membrane, ear canal  and external ear normal.     Nose: Nose normal.     Mouth/Throat:     Mouth: Mucous membranes are moist.     Pharynx: Oropharynx is clear.  Eyes:     Extraocular Movements: Extraocular movements intact.     Pupils: Pupils are equal, round, and reactive to light.  Cardiovascular:     Rate and Rhythm: Normal rate and regular rhythm.     Pulses: Normal pulses.     Heart sounds: Normal heart sounds.  Pulmonary:     Effort: Pulmonary effort is normal.     Breath sounds: Normal breath sounds.  Abdominal:     General: Abdomen is flat. Bowel sounds are normal.     Palpations: Abdomen is soft.  Musculoskeletal:        General: Normal range of motion.     Cervical back:  Normal range of motion and neck supple.  Skin:    General: Skin is warm and dry.  Neurological:     Mental Status: He is alert.  Psychiatric:        Mood and Affect: Mood normal.        Behavior: Behavior normal.        Thought Content: Thought content normal.        Judgment: Judgment normal.        Assessment & Plan:    Routine Health Maintenance and Physical Exam  Health Maintenance  Topic Date Due   DTaP/Tdap/Td vaccine (2 - Td or Tdap) 03/13/2019   COVID-19 Vaccine (2 - Janssen risk series) 12/21/2022*   Flu Shot  04/30/2023*   Hepatitis C Screening  05/25/2023*   HIV Screening  05/25/2023*   HPV Vaccine  Aged Out  *Topic was postponed. The date shown is not the original due date.    Wellness examination Assessment & Plan: Recent lab results reviewed and discussed today.  Review of PMH, FH, SH, medications and HM performed. Preventative care hand-out provided.  Recommend healthy diet.  Recommend approximately 150 minutes/week of moderate intensity exercise. Recommend regular dental and vision exams. Always use seatbelt/lap and shoulder restraints. Recommend using smoke alarms and checking batteries at least twice a year. Recommend using sunscreen when outside. Discussed immunization recommendations for influenza and tetanus immunizations. Patient declines at this time, will consider.    Dysfunction of both eustachian tubes Assessment & Plan: Patient reports ear popping for the past few months. He has seen allergy in the past with minimal relief with nasal spray. Would like referral to ENT.   Orders: -     Ambulatory referral to ENT  Primary hypertension Assessment & Plan: Provided refill today.   Orders: -     guanFACINE HCl; Take 1 tablet (1 mg total) by mouth at bedtime.  Dispense: 90 tablet; Refill: 3     Return in about 6 months (around 06/04/2023) for HTN follow-up.     Alyson Reedy, FNP

## 2022-12-05 NOTE — Assessment & Plan Note (Signed)
Patient reports ear popping for the past few months. He has seen allergy in the past with minimal relief with nasal spray. Would like referral to ENT.

## 2022-12-05 NOTE — Assessment & Plan Note (Signed)
Provided refill today.

## 2022-12-05 NOTE — Assessment & Plan Note (Signed)
Recent lab results reviewed and discussed today.  Review of PMH, FH, SH, medications and HM performed. Preventative care hand-out provided.  Recommend healthy diet.  Recommend approximately 150 minutes/week of moderate intensity exercise. Recommend regular dental and vision exams. Always use seatbelt/lap and shoulder restraints. Recommend using smoke alarms and checking batteries at least twice a year. Recommend using sunscreen when outside. Discussed immunization recommendations for influenza and tetanus immunizations. Patient declines at this time, will consider.

## 2022-12-11 ENCOUNTER — Encounter (INDEPENDENT_AMBULATORY_CARE_PROVIDER_SITE_OTHER): Payer: Self-pay | Admitting: Otolaryngology

## 2022-12-12 DIAGNOSIS — F411 Generalized anxiety disorder: Secondary | ICD-10-CM | POA: Diagnosis not present

## 2022-12-13 DIAGNOSIS — M9903 Segmental and somatic dysfunction of lumbar region: Secondary | ICD-10-CM | POA: Diagnosis not present

## 2022-12-21 ENCOUNTER — Encounter (HOSPITAL_BASED_OUTPATIENT_CLINIC_OR_DEPARTMENT_OTHER): Payer: Self-pay | Admitting: Family Medicine

## 2023-01-02 DIAGNOSIS — F411 Generalized anxiety disorder: Secondary | ICD-10-CM | POA: Diagnosis not present

## 2023-01-03 DIAGNOSIS — M9903 Segmental and somatic dysfunction of lumbar region: Secondary | ICD-10-CM | POA: Diagnosis not present

## 2023-01-10 DIAGNOSIS — M9903 Segmental and somatic dysfunction of lumbar region: Secondary | ICD-10-CM | POA: Diagnosis not present

## 2023-01-12 DIAGNOSIS — F411 Generalized anxiety disorder: Secondary | ICD-10-CM | POA: Diagnosis not present

## 2023-01-16 DIAGNOSIS — F411 Generalized anxiety disorder: Secondary | ICD-10-CM | POA: Diagnosis not present

## 2023-01-29 DIAGNOSIS — M9903 Segmental and somatic dysfunction of lumbar region: Secondary | ICD-10-CM | POA: Diagnosis not present

## 2023-02-13 DIAGNOSIS — F411 Generalized anxiety disorder: Secondary | ICD-10-CM | POA: Diagnosis not present

## 2023-02-19 DIAGNOSIS — M9903 Segmental and somatic dysfunction of lumbar region: Secondary | ICD-10-CM | POA: Diagnosis not present

## 2023-03-06 ENCOUNTER — Telehealth (INDEPENDENT_AMBULATORY_CARE_PROVIDER_SITE_OTHER): Payer: Self-pay | Admitting: Otolaryngology

## 2023-03-06 DIAGNOSIS — F411 Generalized anxiety disorder: Secondary | ICD-10-CM | POA: Diagnosis not present

## 2023-03-06 NOTE — Telephone Encounter (Signed)
LVM to confirm appt & location [phone] afm

## 2023-03-07 ENCOUNTER — Encounter (INDEPENDENT_AMBULATORY_CARE_PROVIDER_SITE_OTHER): Payer: Self-pay

## 2023-03-07 ENCOUNTER — Ambulatory Visit (INDEPENDENT_AMBULATORY_CARE_PROVIDER_SITE_OTHER): Payer: BC Managed Care – PPO | Admitting: Otolaryngology

## 2023-03-07 VITALS — BP 144/85 | HR 74 | Ht 73.0 in | Wt 180.0 lb

## 2023-03-07 DIAGNOSIS — J342 Deviated nasal septum: Secondary | ICD-10-CM

## 2023-03-07 DIAGNOSIS — J310 Chronic rhinitis: Secondary | ICD-10-CM

## 2023-03-07 DIAGNOSIS — J343 Hypertrophy of nasal turbinates: Secondary | ICD-10-CM

## 2023-03-07 DIAGNOSIS — H6983 Other specified disorders of Eustachian tube, bilateral: Secondary | ICD-10-CM

## 2023-03-07 DIAGNOSIS — R0981 Nasal congestion: Secondary | ICD-10-CM

## 2023-03-08 DIAGNOSIS — J310 Chronic rhinitis: Secondary | ICD-10-CM | POA: Insufficient documentation

## 2023-03-08 DIAGNOSIS — J342 Deviated nasal septum: Secondary | ICD-10-CM | POA: Insufficient documentation

## 2023-03-08 DIAGNOSIS — J343 Hypertrophy of nasal turbinates: Secondary | ICD-10-CM | POA: Insufficient documentation

## 2023-03-08 NOTE — Progress Notes (Signed)
 Patient ID: Joshua Wise, male   DOB: 1990/02/24, 33 y.o.   MRN: [id number]  CC: Popping sensation in ears, chronic nasal obstruction  HPI:  Joshua Wise is a 33 y.o. male who presents today complaining of frequent popping and pressure sensation in his ears for the past 2 years.  He also complains of chronic nasal obstruction.  He has significant difficulty performing the Valsalva exercise to auto insufflate his middle ear spaces.  He denies any change in his hearing.  He has no recent otitis media or otitis externa.  He has no previous ENT surgery.  He has a history of environmental allergies.  He previously underwent immunotherapy for 4 years.  He stopped the allergy shots 1 year ago.  Currently he is on Xyzal and Xhance  daily.  Despite multiple years of medical treatment, he continues to have bilateral chronic nasal obstruction.  He denies any significant otalgia, otorrhea, vertigo, facial pain, or fever.  Past Medical History:  Diagnosis Date   Allergic rhinitis 09/17/2019   Eczema    Hypertension     Past Surgical History:  Procedure Laterality Date   None     VASECTOMY      Family History  Problem Relation Age of Onset   Healthy Mother    Hypertension Father    Migraines Father    Cancer Paternal Grandmother        melanoma and breast cancer   Cancer Paternal Grandfather        bladder and colon cancer    Social History:  reports that he has never smoked. He has never used smokeless tobacco. He reports current alcohol use. He reports that he does not use drugs.  Allergies: No Known Allergies  Prior to Admission medications   Medication Sig Start Date End Date Taking? Authorizing Provider  guanFACINE  (TENEX ) 1 MG tablet Take 1 tablet (1 mg total) by mouth at bedtime. 12/05/22  Yes Towana Small, FNP    Blood pressure (!) 144/85, pulse 74, height 6' 1 (1.854 m), weight 180 lb (81.6 kg), SpO2 97%. Exam: General: Communicates without difficulty, well nourished, no  acute distress. Head: Normocephalic, no evidence injury, no tenderness, facial buttresses intact without stepoff. Face/sinus: No tenderness to palpation and percussion. Facial movement is normal and symmetric. Eyes: PERRL, EOMI. No scleral icterus, conjunctivae clear. Neuro: CN II exam reveals vision grossly intact.  No nystagmus at any point of gaze. Ears: Auricles well formed without lesions.  Ear canals are intact without mass or lesion.  No erythema or edema is appreciated.  The TMs are intact without fluid. Nose: External evaluation reveals normal support and skin without lesions.  Dorsum is intact.  Anterior rhinoscopy reveals congested mucosa over anterior aspect of inferior turbinates and severely deviated septum.  No purulence noted. Oral:  Oral cavity and oropharynx are intact, symmetric, without erythema or edema.  Mucosa is moist without lesions. Neck: Full range of motion without pain.  There is no significant lymphadenopathy.  No masses palpable.  Thyroid bed within normal limits to palpation.  Parotid glands and submandibular glands equal bilaterally without mass.  Trachea is midline. Neuro:  CN 2-12 grossly intact.   Assessment: 1.  Chronic rhinitis with nasal mucosal congestion, severe nasal septal deviation, and bilateral inferior turbinate hypertrophy.  More than 95% of his nasal passageways are obstructed bilaterally. 2.  Bilateral clinical eustachian tube dysfunction.  He has difficulty performing the Valsalva exercise to auto insufflate his middle ear spaces. 3.  His ear canals,  tympanic membranes, and middle ear spaces are otherwise normal.  Plan: 1.  The physical exam findings are reviewed with the patient. 2.  Flonase/Xhance  nasal spray 2 sprays each nostril daily.  The importance of consistent daily use is discussed. 3.  Valsalva exercise multiple times a day. 4.  The option of septoplasty and turbinate reduction surgery to treat his chronic nasal obstruction is discussed.  The  risk, benefits, and details of the procedures are reviewed.  Questions are invited and answered. 5.  The patient would like to consider his options.  He will return for reevaluation in 6 weeks.  Alyiah Ulloa W Hever Castilleja 03/08/2023, 11:04 AM

## 2023-03-12 ENCOUNTER — Other Ambulatory Visit (INDEPENDENT_AMBULATORY_CARE_PROVIDER_SITE_OTHER): Payer: Self-pay | Admitting: Otolaryngology

## 2023-03-12 DIAGNOSIS — J343 Hypertrophy of nasal turbinates: Secondary | ICD-10-CM

## 2023-03-12 DIAGNOSIS — M9903 Segmental and somatic dysfunction of lumbar region: Secondary | ICD-10-CM | POA: Diagnosis not present

## 2023-03-12 DIAGNOSIS — J342 Deviated nasal septum: Secondary | ICD-10-CM

## 2023-03-27 DIAGNOSIS — F411 Generalized anxiety disorder: Secondary | ICD-10-CM | POA: Diagnosis not present

## 2023-04-02 ENCOUNTER — Telehealth (HOSPITAL_BASED_OUTPATIENT_CLINIC_OR_DEPARTMENT_OTHER): Payer: Self-pay | Admitting: *Deleted

## 2023-04-02 DIAGNOSIS — M9903 Segmental and somatic dysfunction of lumbar region: Secondary | ICD-10-CM | POA: Diagnosis not present

## 2023-04-02 NOTE — Telephone Encounter (Signed)
 Copied from CRM [id number]. Topic: Appointments - Transfer of Care >> Apr 02, 2023 10:30 AM Gery Pray wrote: Pt is requesting to transfer FROM: Joshua Wise Pt is requesting to transfer TO: Jerre Simon Reason for requested transfer: Provider no longer with practice It is the responsibility of the team the patient would like to transfer to (Dr. Jerre Simon) to reach out to the patient if for any reason this transfer is not acceptable.

## 2023-04-03 ENCOUNTER — Encounter: Payer: Self-pay | Admitting: Family Medicine

## 2023-04-03 ENCOUNTER — Ambulatory Visit (INDEPENDENT_AMBULATORY_CARE_PROVIDER_SITE_OTHER): Admitting: Family Medicine

## 2023-04-03 VITALS — BP 135/82 | HR 79 | Temp 98.1°F | Ht 73.0 in | Wt 190.2 lb

## 2023-04-03 DIAGNOSIS — F411 Generalized anxiety disorder: Secondary | ICD-10-CM | POA: Diagnosis not present

## 2023-04-03 DIAGNOSIS — F320 Major depressive disorder, single episode, mild: Secondary | ICD-10-CM

## 2023-04-03 MED ORDER — SERTRALINE HCL 25 MG PO TABS: 25.0000 mg | ORAL_TABLET | Freq: Every day | ORAL | 3 refills | Status: DC

## 2023-04-03 NOTE — Progress Notes (Signed)
 Established Patient Office Visit  Subjective  Patient ID: Joshua Wise, male    DOB: [birth year]  Age: 33 y.o. MRN: [id number]  Chief Complaint  Patient presents with   New Med Request    Pt. Stats of wanting to talk about a new medication.    Patient's current HTN/ADHD medication regimen is: guanfacine 1mg  at bedtime and feel his ADHD is well controlled.   ANXIETY: Joshua Wise is a 33 year old male patient who presents for concerns regarding anxiety. He does have 3 children at home, including a set of twins. He has struggled with anxiety throughout his life. He has good regimen of self care but the last year, but states that "it has not been quite enough." He would like to start on Zoloft. His wife has been dealing with similar mood concerns. Reports he does sleep well (about 8 hours per night) but he does toss/turn a lot. He is still exercising 5x/week, wakes up early to have time to himself, and has been going to counseling for years now.      04/03/2023    2:17 PM 05/25/2022   11:10 AM  GAD 7 : Generalized Anxiety Score  Nervous, Anxious, on Edge 3 0  Control/stop worrying 3 0  Worry too much - different things 1 0  Trouble relaxing 3 0  Restless 3 0  Easily annoyed or irritable 1 0  Afraid - awful might happen 1 0  Total GAD 7 Score 15 0  Anxiety Difficulty Not difficult at all Not difficult at all      04/03/2023    2:15 PM 05/25/2022   11:10 AM 10/10/2018    9:24 AM  PHQ9 SCORE ONLY  PHQ-9 Total Score 10 0 0   BP Readings from Last 3 Encounters:  04/03/23 135/82  03/07/23 (!) 144/85  12/05/22 123/85   ROS: see HPI     Objective:   BP 135/82 (BP Location: Right Arm, Patient Position: Sitting, Cuff Size: Normal)   Pulse 79   Temp 98.1 F (36.7 C) (Oral)   Ht 6\' 1"  (1.854 m)   Wt 190 lb 3.2 oz (86.3 kg)   SpO2 96%   BMI 25.09 kg/m  BP Readings from Last 3 Encounters:  04/03/23 135/82  03/07/23 (!) 144/85  12/05/22 123/85    Physical Exam Vitals  reviewed.  Constitutional:      Appearance: Normal appearance.  Cardiovascular:     Rate and Rhythm: Normal rate and regular rhythm.     Pulses: Normal pulses.     Heart sounds: Normal heart sounds.  Pulmonary:     Effort: Pulmonary effort is normal.     Breath sounds: Normal breath sounds.  Neurological:     Mental Status: He is alert.  Psychiatric:        Mood and Affect: Mood normal.        Behavior: Behavior normal.     Assessment & Plan:   1. GAD (generalized anxiety disorder) (Primary) GAD7 completed with score of 15, elevated from previous visit. He reports that he has noticed an increase in anxiety, despite great self-care routine, daily physical exercise 5x/week, and frequent counseling. Discussed pharmacotherapy- including potential side effects, adverse effects, importance of taking medication daily, and that it may take 4-6 weeks to notice maximum therapeutic effect. Rx sent for Zoloft 25mg . Plan to follow-up in 4-6 weeks for mood. He plans to continue with counseling.  - sertraline (ZOLOFT) 25 MG tablet; Take 1 tablet (25 mg total)  by mouth daily.  Dispense: 30 tablet; Refill: 3  2. Current mild episode of major depressive disorder without prior episode (HCC) PHQ9 completed with score of 10. Denies SI/HI. Rx sent for Zoloft 25mg  daily.  - sertraline (ZOLOFT) 25 MG tablet; Take 1 tablet (25 mg total) by mouth daily.  Dispense: 30 tablet; Refill: 3    Return in about 4 weeks (around 05/01/2023) for Mood f/u.    Alyson Reedy, FNP

## 2023-04-03 NOTE — Patient Instructions (Signed)

## 2023-04-09 ENCOUNTER — Encounter: Payer: Self-pay | Admitting: Family Medicine

## 2023-04-13 ENCOUNTER — Encounter (INDEPENDENT_AMBULATORY_CARE_PROVIDER_SITE_OTHER): Payer: BC Managed Care – PPO

## 2023-04-17 DIAGNOSIS — F411 Generalized anxiety disorder: Secondary | ICD-10-CM | POA: Diagnosis not present

## 2023-04-24 ENCOUNTER — Ambulatory Visit (INDEPENDENT_AMBULATORY_CARE_PROVIDER_SITE_OTHER): Payer: BC Managed Care – PPO

## 2023-04-24 DIAGNOSIS — M9903 Segmental and somatic dysfunction of lumbar region: Secondary | ICD-10-CM | POA: Diagnosis not present

## 2023-05-02 DIAGNOSIS — F411 Generalized anxiety disorder: Secondary | ICD-10-CM | POA: Diagnosis not present

## 2023-05-09 ENCOUNTER — Ambulatory Visit: Admitting: Family Medicine

## 2023-05-17 DIAGNOSIS — M9903 Segmental and somatic dysfunction of lumbar region: Secondary | ICD-10-CM | POA: Diagnosis not present

## 2023-05-21 ENCOUNTER — Encounter (HOSPITAL_BASED_OUTPATIENT_CLINIC_OR_DEPARTMENT_OTHER): Admitting: Family Medicine

## 2023-05-22 DIAGNOSIS — F411 Generalized anxiety disorder: Secondary | ICD-10-CM | POA: Diagnosis not present

## 2023-06-04 ENCOUNTER — Ambulatory Visit (HOSPITAL_BASED_OUTPATIENT_CLINIC_OR_DEPARTMENT_OTHER): Payer: BC Managed Care – PPO | Admitting: Family Medicine

## 2023-06-04 DIAGNOSIS — M9903 Segmental and somatic dysfunction of lumbar region: Secondary | ICD-10-CM | POA: Diagnosis not present

## 2023-06-11 ENCOUNTER — Other Ambulatory Visit: Payer: Self-pay

## 2023-06-11 DIAGNOSIS — I10 Essential (primary) hypertension: Secondary | ICD-10-CM

## 2023-06-11 MED ORDER — GUANFACINE HCL 1 MG PO TABS: 1.0000 mg | ORAL_TABLET | Freq: Every day | ORAL | 0 refills | Status: DC

## 2023-06-12 DIAGNOSIS — F411 Generalized anxiety disorder: Secondary | ICD-10-CM | POA: Diagnosis not present

## 2023-06-26 DIAGNOSIS — M9903 Segmental and somatic dysfunction of lumbar region: Secondary | ICD-10-CM | POA: Diagnosis not present

## 2023-07-03 DIAGNOSIS — F411 Generalized anxiety disorder: Secondary | ICD-10-CM | POA: Diagnosis not present

## 2023-07-16 DIAGNOSIS — M9903 Segmental and somatic dysfunction of lumbar region: Secondary | ICD-10-CM | POA: Diagnosis not present

## 2023-08-01 ENCOUNTER — Other Ambulatory Visit: Payer: Self-pay | Admitting: Family Medicine

## 2023-08-01 DIAGNOSIS — F411 Generalized anxiety disorder: Secondary | ICD-10-CM

## 2023-08-01 DIAGNOSIS — F320 Major depressive disorder, single episode, mild: Secondary | ICD-10-CM

## 2023-08-06 DIAGNOSIS — M9903 Segmental and somatic dysfunction of lumbar region: Secondary | ICD-10-CM | POA: Diagnosis not present

## 2023-08-10 ENCOUNTER — Encounter: Payer: Self-pay | Admitting: Family Medicine

## 2023-08-10 DIAGNOSIS — F320 Major depressive disorder, single episode, mild: Secondary | ICD-10-CM

## 2023-08-10 DIAGNOSIS — F411 Generalized anxiety disorder: Secondary | ICD-10-CM

## 2023-08-10 MED ORDER — SERTRALINE HCL 25 MG PO TABS: 25.0000 mg | ORAL_TABLET | Freq: Every day | ORAL | 3 refills | Status: DC

## 2023-08-14 DIAGNOSIS — F411 Generalized anxiety disorder: Secondary | ICD-10-CM | POA: Diagnosis not present

## 2023-09-25 DIAGNOSIS — F411 Generalized anxiety disorder: Secondary | ICD-10-CM | POA: Diagnosis not present

## 2023-10-22 ENCOUNTER — Ambulatory Visit: Payer: Self-pay

## 2023-10-22 ENCOUNTER — Ambulatory Visit: Admitting: Family Medicine

## 2023-10-22 DIAGNOSIS — J0100 Acute maxillary sinusitis, unspecified: Secondary | ICD-10-CM | POA: Diagnosis not present

## 2023-10-22 NOTE — Telephone Encounter (Signed)
 FYI Only or Action Required?: Action required by provider: request for appointment.  Patient was last seen in primary care on 04/03/2023 by Towana Small, FNP.  Called Nurse Triage reporting Cough.  Symptoms began several days ago.  Interventions attempted: OTC medications: Sudafed.  Symptoms are: unchanged.  Triage Disposition: See Physician Within 24 Hours  Patient/caregiver understands and will follow disposition?:   Answer Assessment - Initial Assessment Questions 1. ONSET: When did the cough begin?      1 week 2. SEVERITY: How bad is the cough today?      moderate 3. SPUTUM: Describe the color of your sputum (e.g., none, dry cough; clear, white, yellow, green)     Green grey 4. HEMOPTYSIS: Are you coughing up any blood? If Yes, ask: How much? (e.g., flecks, streaks, tablespoons, etc.)     Small amount 5. DIFFICULTY BREATHING: Are you having difficulty breathing? If Yes, ask: How bad is it? (e.g., mild, moderate, severe)      mild 6. FEVER: Do you have a fever? If Yes, ask: What is your temperature, how was it measured, and when did it start?     no 7. CARDIAC HISTORY: Do you have any history of heart disease? (e.g., heart attack, congestive heart failure)      no 8. LUNG HISTORY: Do you have any history of lung disease?  (e.g., pulmonary embolus, asthma, emphysema)     no 9. PE RISK FACTORS: Do you have a history of blood clots? (or: recent major surgery, recent prolonged travel, bedridden)     no 10. OTHER SYMPTOMS: Do you have any other symptoms? (e.g., runny nose, wheezing, chest pain)       Sneezing, sore throat 11. PREGNANCY: Is there any chance you are pregnant? When was your last menstrual period?       no 12. TRAVEL: Have you traveled out of the country in the last month? (e.g., travel history, exposures)       no  Protocols used: Cough - Acute Productive-A-AH

## 2023-11-05 DIAGNOSIS — M9903 Segmental and somatic dysfunction of lumbar region: Secondary | ICD-10-CM | POA: Diagnosis not present

## 2023-11-15 DIAGNOSIS — J342 Deviated nasal septum: Secondary | ICD-10-CM | POA: Diagnosis not present

## 2023-11-15 DIAGNOSIS — J3489 Other specified disorders of nose and nasal sinuses: Secondary | ICD-10-CM | POA: Diagnosis not present

## 2023-11-15 DIAGNOSIS — J343 Hypertrophy of nasal turbinates: Secondary | ICD-10-CM | POA: Diagnosis not present

## 2023-11-15 HISTORY — PX: OTHER SURGICAL HISTORY: SHX169

## 2023-11-16 ENCOUNTER — Other Ambulatory Visit (INDEPENDENT_AMBULATORY_CARE_PROVIDER_SITE_OTHER): Payer: Self-pay | Admitting: Otolaryngology

## 2023-11-16 ENCOUNTER — Telehealth (INDEPENDENT_AMBULATORY_CARE_PROVIDER_SITE_OTHER): Payer: Self-pay

## 2023-11-16 MED ORDER — OXYCODONE-ACETAMINOPHEN 5-325 MG PO TABS: 1.0000 | ORAL_TABLET | ORAL | 0 refills | Status: AC | PRN

## 2023-11-16 NOTE — Telephone Encounter (Signed)
 Pt wife called to ask questions about the pain and the clotting going on since the surgery yesterday. I spoke with Karis he advised me to let the patient know the clotting is normal and that he was sending a prescription over to the pharmacy for him.

## 2023-11-19 ENCOUNTER — Ambulatory Visit (INDEPENDENT_AMBULATORY_CARE_PROVIDER_SITE_OTHER): Admitting: Otolaryngology

## 2023-11-19 ENCOUNTER — Encounter (INDEPENDENT_AMBULATORY_CARE_PROVIDER_SITE_OTHER): Payer: Self-pay | Admitting: Otolaryngology

## 2023-11-19 VITALS — BP 142/94 | HR 90 | Temp 97.7°F

## 2023-11-19 DIAGNOSIS — Z9889 Other specified postprocedural states: Secondary | ICD-10-CM

## 2023-11-19 DIAGNOSIS — J310 Chronic rhinitis: Secondary | ICD-10-CM

## 2023-11-19 NOTE — Progress Notes (Signed)
 Doyle splints removed. Septum and turbinates are healing well.   Both [place] debrided.  Nasal saline irrigation.  Recheck in 3 weeks.

## 2023-11-26 DIAGNOSIS — M9903 Segmental and somatic dysfunction of lumbar region: Secondary | ICD-10-CM | POA: Diagnosis not present

## 2023-12-10 ENCOUNTER — Encounter (INDEPENDENT_AMBULATORY_CARE_PROVIDER_SITE_OTHER): Payer: Self-pay | Admitting: Otolaryngology

## 2023-12-10 ENCOUNTER — Ambulatory Visit (INDEPENDENT_AMBULATORY_CARE_PROVIDER_SITE_OTHER): Admitting: Otolaryngology

## 2023-12-10 VITALS — BP 122/77 | HR 68 | Temp 97.9°F | Ht 72.0 in | Wt 190.0 lb

## 2023-12-10 DIAGNOSIS — J310 Chronic rhinitis: Secondary | ICD-10-CM

## 2023-12-10 NOTE — Progress Notes (Signed)
 Patient ID: Joshua Wise, male   DOB: 1990/09/07, 33 y.o.   MRN: [id number]  Septum and turbinates are healing well.   Both [place] debrided.   Nasal saline irrigation as needed.   Recheck in 6 months.

## 2023-12-13 ENCOUNTER — Other Ambulatory Visit: Payer: Self-pay | Admitting: Family Medicine

## 2023-12-13 DIAGNOSIS — F411 Generalized anxiety disorder: Secondary | ICD-10-CM

## 2023-12-13 DIAGNOSIS — F320 Major depressive disorder, single episode, mild: Secondary | ICD-10-CM

## 2023-12-18 ENCOUNTER — Encounter: Payer: Self-pay | Admitting: Family Medicine

## 2023-12-18 ENCOUNTER — Ambulatory Visit (INDEPENDENT_AMBULATORY_CARE_PROVIDER_SITE_OTHER): Admitting: Family Medicine

## 2023-12-18 VITALS — BP 127/86 | HR 85 | Temp 98.9°F | Resp 18 | Ht 72.0 in | Wt 190.0 lb

## 2023-12-18 DIAGNOSIS — F320 Major depressive disorder, single episode, mild: Secondary | ICD-10-CM

## 2023-12-18 DIAGNOSIS — Z23 Encounter for immunization: Secondary | ICD-10-CM | POA: Diagnosis not present

## 2023-12-18 DIAGNOSIS — Z0000 Encounter for general adult medical examination without abnormal findings: Secondary | ICD-10-CM | POA: Diagnosis not present

## 2023-12-18 DIAGNOSIS — F411 Generalized anxiety disorder: Secondary | ICD-10-CM | POA: Diagnosis not present

## 2023-12-18 DIAGNOSIS — I10 Essential (primary) hypertension: Secondary | ICD-10-CM

## 2023-12-18 MED ORDER — GUANFACINE HCL 1 MG PO TABS: 1.0000 mg | ORAL_TABLET | Freq: Every day | ORAL | 3 refills | Status: AC

## 2023-12-18 MED ORDER — SERTRALINE HCL 25 MG PO TABS: 25.0000 mg | ORAL_TABLET | Freq: Every day | ORAL | 3 refills | Status: AC

## 2023-12-18 NOTE — Progress Notes (Signed)
 Subjective:   Joshua Wise Jul 15, 1990 12/18/2023  CC: Chief Complaint  Patient presents with   Establish Care   HPI: Joshua Wise is a 33 y.o. male who presents for a routine health maintenance exam. Non-fasting labs collected at visit today.   HEALTH SCREENINGS: - Vision Screening: needs to schedule - Dental Visits: needs to schedule  - Testicular Exam: Declined - STD Screening: Declined - PSA (50+): Not applicable  - Colonoscopy (45+): Not applicable  Discussed with patient purpose of the colonoscopy is to detect colon cancer at curable precancerous or early stages  - AAA Screening: Not applicable  Men age 55-75 who have ever smoked - Lung Cancer screening with low-dose CT: Not applicable-  Adults age 9-80 who are current cigarette smokers or quit within the last 15 years. Must have 20 pack year history.   Depression and Anxiety Screen done today and results listed below:     12/18/2023    1:34 PM 04/03/2023    2:15 PM 05/25/2022   11:10 AM 10/10/2018    9:24 AM 07/08/2018    4:17 PM  Depression screen PHQ 2/9  Decreased Interest 0 0 0 0 0  Down, Depressed, Hopeless 0 1 0 0 0  PHQ - 2 Score 0 1 0 0 0  Altered sleeping 0 1     Tired, decreased energy 0 1     Change in appetite 0 0     Feeling bad or failure about yourself  0 1     Trouble concentrating 1 3     Moving slowly or fidgety/restless 0 3     Suicidal thoughts 0 0     PHQ-9 Score 1 10      Difficult doing work/chores Somewhat difficult Somewhat difficult Not difficult at all       Data saved with a previous flowsheet row definition      12/18/2023    1:35 PM 04/03/2023    2:17 PM 05/25/2022   11:10 AM  GAD 7 : Generalized Anxiety Score  Nervous, Anxious, on Edge 1 3 0  Control/stop worrying 1 3 0  Worry too much - different things 0 1 0  Trouble relaxing 1 3 0  Restless 1 3 0  Easily annoyed or irritable 1 1 0  Afraid - awful might happen 0 1 0  Total GAD 7 Score 5 15 0  Anxiety Difficulty  Somewhat difficult Not difficult at all Not difficult at all   IMMUNIZATIONS:  - Tdap: Tetanus vaccination status reviewed: Td vaccination indicated and given today. - Influenza: Refused - Pneumovax: Not applicable - Prevnar: Not applicable - Shingrix vaccine (50+): Not applicable  Past medical history, surgical history, medications, allergies, family history and social history reviewed with patient today and changes made to appropriate areas of the chart.   Past Medical History:  Diagnosis Date   Allergic rhinitis 09/17/2019   Eczema    Hypertension     Past Surgical History:  Procedure Laterality Date   None     Septoplasty, turbinate reduction Bilateral 11/15/2023   VASECTOMY      No current outpatient medications on file prior to visit.   No current facility-administered medications on file prior to visit.    No Known Allergies   Social History   Socioeconomic History   Marital status: Married    Spouse name: Not on file   Number of children: 0   Years of education: 14   Highest education level: Not on  file  Occupational History   Occupation: airline pilot  Tobacco Use   Smoking status: Never   Smokeless tobacco: Never  Vaping Use   Vaping status: Never Used  Substance and Sexual Activity   Alcohol use: Yes    Comment: occasional, 1-2 nights per week (4-6 wine/beer)    Drug use: No   Sexual activity: Yes    Birth control/protection: None  Other Topics Concern   Not on file  Social History Narrative   Lives with wife in a one story home with a basement.  No children.     Works in airline pilot - building supples    Education: some college.    Social Drivers of Corporate Investment Banker Strain: Not on file  Food Insecurity: Not on file  Transportation Needs: Not on file  Physical Activity: Not on file  Stress: Not on file  Social Connections: Not on file  Intimate Partner Violence: Not on file   Social History   Tobacco Use  Smoking Status Never   Smokeless Tobacco Never   Social History   Substance and Sexual Activity  Alcohol Use Yes   Comment: occasional, 1-2 nights per week (4-6 wine/beer)    Family History  Problem Relation Age of Onset   Healthy Mother    Hypertension Father    Migraines Father    Cancer Paternal Grandmother        melanoma and breast cancer   Cancer Paternal Grandfather        bladder and colon cancer   ROS: Denies fever, fatigue, unexplained weight loss/gain, CP, SHOB, and palpatitations. Denies neurological deficits, gastrointestinal and/or genitourinary complaints, and skin changes.   Objective:   Today's Vitals   12/18/23 1329 12/18/23 1400  BP: (!) 148/95 127/86  Pulse: 85   Resp: 18   Temp: 98.9 F (37.2 C)   TempSrc: Oral   SpO2: 96%   Weight: 190 lb (86.2 kg)   Height: 6' (1.829 m)   PainSc: 0-No pain     GENERAL APPEARANCE: Well-appearing, in NAD. Well nourished.  SKIN: Pink, warm and dry. Turgor normal. No rash, lesion, ulceration, or ecchymoses. Hair evenly distributed.  HEENT: HEAD: Normocephalic.  EYES: PERRLA. EOMI. Lids intact w/o defect. Sclera white, Conjunctiva pink w/o exudate.  EARS: External ear w/o redness, swelling, masses or lesions. EAC clear. TM's intact, translucent w/o bulging, appropriate landmarks visualized. Appropriate acuity to conversational tones.  NOSE: Septum midline w/o deformity. Nares patent, mucosa pink and non-inflamed w/o drainage. No sinus tenderness.  THROAT: Uvula midline. Oropharynx clear. Tonsils non-inflamed w/o exudate. Oral mucosa pink and moist.  NECK: Supple, Trachea midline. Full ROM w/o pain or tenderness. No lymphadenopathy. Thyroid non-tender w/o enlargement or palpable masses.  RESPIRATORY: Chest wall symmetrical w/o masses. Respirations even and non-labored. Breath sounds clear to auscultation bilaterally. No wheezes, rales, rhonchi, or crackles. CARDIAC: S1, S2 present, regular rate and rhythm. No gallops, murmurs, rubs, or  clicks. PMI w/o lifts, heaves, or thrills. No carotid bruits. Capillary refill <2 seconds. Peripheral pulses 2+ bilaterally. GI: Abdomen soft w/o distention. Normoactive bowel sounds. No palpable masses or tenderness. No guarding or rebound tenderness. Liver and spleen w/o tenderness or enlargement. No CVA tenderness.  GU: Deferred exam. MSK: Muscle tone and strength appropriate for age, w/o atrophy or abnormal movement. EXTREMITIES: Active ROM intact, w/o tenderness, crepitus, or contracture. No obvious joint deformities or effusions. No clubbing, edema, or cyanosis.  NEUROLOGIC: CN's II-XII intact. Motor strength symmetrical with no obvious weakness. No  sensory deficits. DTR 2+ symmetric bilaterally. Steady, even gait.  PSYCH/MENTAL STATUS: Alert, oriented x 3. Cooperative, appropriate mood and affect.   Results for orders placed or performed in visit on 11/28/22  CBC with Differential/Platelet   Collection Time: 11/28/22  8:30 AM  Result Value Ref Range   WBC 4.6 3.4 - 10.8 x10E3/uL   RBC 4.59 4.14 - 5.80 x10E6/uL   Hemoglobin 14.6 13.0 - 17.7 g/dL   Hematocrit 56.5 62.4 - 51.0 %   MCV 95 79 - 97 fL   MCH 31.8 26.6 - 33.0 pg   MCHC 33.6 31.5 - 35.7 g/dL   RDW 86.7 88.3 - 84.5 %   Platelets 236 150 - 450 x10E3/uL   Neutrophils 58 Not Estab. %   Lymphs 29 Not Estab. %   Monocytes 8 Not Estab. %   Eos 4 Not Estab. %   Basos 1 Not Estab. %   Neutrophils Absolute 2.7 1.4 - 7.0 x10E3/uL   Lymphocytes Absolute 1.4 0.7 - 3.1 x10E3/uL   Monocytes Absolute 0.4 0.1 - 0.9 x10E3/uL   EOS (ABSOLUTE) 0.2 0.0 - 0.4 x10E3/uL   Basophils Absolute 0.0 0.0 - 0.2 x10E3/uL   Immature Granulocytes 0 Not Estab. %   Immature Grans (Abs) 0.0 0.0 - 0.1 x10E3/uL  Comprehensive metabolic panel   Collection Time: 11/28/22  8:30 AM  Result Value Ref Range   Glucose 65 (L) 70 - 99 mg/dL   BUN 13 6 - 20 mg/dL   Creatinine, Ser 8.88 0.76 - 1.27 mg/dL   eGFR 90 >40 fO/fpw/8.26   BUN/Creatinine Ratio 12 9 -  20   Sodium 140 134 - 144 mmol/L   Potassium 4.3 3.5 - 5.2 mmol/L   Chloride 101 96 - 106 mmol/L   CO2 25 20 - 29 mmol/L   Calcium 9.4 8.7 - 10.2 mg/dL   Total Protein 7.0 6.0 - 8.5 g/dL   Albumin 4.6 4.1 - 5.1 g/dL   Globulin, Total 2.4 1.5 - 4.5 g/dL   Bilirubin Total 0.6 0.0 - 1.2 mg/dL   Alkaline Phosphatase 64 44 - 121 IU/L   AST 23 0 - 40 IU/L   ALT 31 0 - 44 IU/L  Lipid panel   Collection Time: 11/28/22  8:30 AM  Result Value Ref Range   Cholesterol, Total 161 100 - 199 mg/dL   Triglycerides 817 (H) 0 - 149 mg/dL   HDL 39 (L) >60 mg/dL   VLDL Cholesterol Cal 31 5 - 40 mg/dL   LDL Chol Calc (NIH) 91 0 - 99 mg/dL   Chol/HDL Ratio 4.1 0.0 - 5.0 ratio  Hemoglobin A1c   Collection Time: 11/28/22  8:30 AM  Result Value Ref Range   Hgb A1c MFr Bld 4.7 (L) 4.8 - 5.6 %   Est. average glucose Bld gHb Est-mCnc 88 mg/dL  TSH Rfx on Abnormal to Free T4   Collection Time: 11/28/22  8:30 AM  Result Value Ref Range   TSH 1.170 0.450 - 4.500 uIU/mL    Assessment & Plan:   1. Wellness examination (Primary) - Encouraged to adjust caloric intake to maintain or achieve ideal body weight, to reduce intake of dietary saturated fat and total fat, to limit sodium intake by avoiding high sodium foods and not adding table salt, and to maintain adequate dietary potassium and calcium preferably from fresh fruits, vegetables, and low-fat dairy products.   - Advised to avoid cigarette smoking. - Discussed with the patient that most people either abstain  from alcohol or drink within safe limits (<=14/week and <=4 drinks/occasion for males, <=7/weeks and <= 3 drinks/occasion for females) and that the risk for alcohol disorders and other health effects rises proportionally with the number of drinks per week and how often a drinker exceeds daily limits. - Discussed cessation/primary prevention of drug use and availability of treatment for abuse.  - Stressed the importance of regular exercise - Injury  prevention: Discussed safety belts, safety helmets, smoke detector, smoking near bedding or upholstery.  - Dental health: Discussed importance of regular tooth brushing, flossing, and dental visits.  - Sexuality: Discussed sexually transmitted diseases, partner selection, use of condoms, avoidance of unintended pregnancy  and contraceptive alternatives.  NEXT PREVENTATIVE PHYSICAL DUE IN 1 YEAR.  2. Primary hypertension Initial BP reading in office elevated, repeat BP improved. Rx refill sent to pharmacy on file.  - guanFACINE  (TENEX ) 1 MG tablet; Take 1 tablet (1 mg total) by mouth at bedtime.  Dispense: 90 tablet; Refill: 3  3. GAD (generalized anxiety disorder) Well controlled. Rx refill sent to pharmacy on file.  - sertraline  (ZOLOFT ) 25 MG tablet; Take 1 tablet (25 mg total) by mouth daily.  Dispense: 90 tablet; Refill: 3  4. Current mild episode of major depressive disorder without prior episode Well controlled. Rx refill sent to pharmacy on file.  - sertraline  (ZOLOFT ) 25 MG tablet; Take 1 tablet (25 mg total) by mouth daily.  Dispense: 90 tablet; Refill: 3  5. Healthcare maintenance Will complete fasting labs today.  - CBC with Differential/Platelet - Comprehensive metabolic panel with GFR - Hemoglobin A1c - Lipid panel - TSH Rfx on Abnormal to Free T4  6. Encounter for immunization Agreeable to receive Tdap immunization today.  - Tdap vaccine greater than or equal to 7yo IM   Return in about 6 months (around 06/16/2024) for Mood f/u.  Patient to reach out to office if new, worrisome, or unresolved symptoms arise or if no improvement in patient's condition. Patient verbalized understanding and is agreeable to treatment plan. All questions answered to patient's satisfaction.    Evalene Arts, FNP

## 2023-12-18 NOTE — Patient Instructions (Signed)
 Health Maintenance Recommendations Colon Cancer Screening Every 1-10 years based on test performed, risk factors, and history Starting at age 33 Bone Density Screening Every 2-10 years based on history Starting at age 80 for women Recommendations for men differ based on medication usage, history, and risk factors AAA Screening One time ultrasound Men 73-31 years old who have every smoked Lung Cancer Screening Low Dose Lung CT every 12 months Age 33-80 years with a 30 pack-year smoking history who still smoke or who have quit within the last 15 years   Screening Labs Routine  Labs: Complete Blood Count (CBC), Complete Metabolic Panel (CMP), Cholesterol (Lipid Panel) Every 6-12 months based on history and medications May be recommended more frequently based on current conditions or previous results Hemoglobin A1c Lab Every 3-12 months based on history and previous results Starting at age 30 or earlier with diagnosis of diabetes, high cholesterol, BMI >26, and/or risk factors Frequent monitoring for patients with diabetes to ensure blood sugar control Thyroid Panel (TSH w/ T3 & T4) Every 6 months based on history, symptoms, and risk factors May be repeated more often if on medication HIV One time testing for all patients 57 and older May be repeated more frequently for patients with increased risk factors or exposure Hepatitis C One time testing for all patients 29 and older May be repeated more frequently for patients with increased risk factors or exposure Gonorrhea, Chlamydia Every 12 months for all sexually active persons 13-24 years Additional monitoring may be recommended for those who are considered high risk or who have symptoms PSA Men 98-20 years old with risk factors Additional screening may be recommended from age 68-69 based on risk factors, symptoms, and history   Vaccine Recommendations Tetanus Booster All adults every 10 years Flu Vaccine All patients 6 months  and older every year COVID Vaccine All patients 12 years and older Initial dosing with booster May recommend additional booster based on age and health history HPV Vaccine 2 doses all patients age 63-26 Dosing may be considered for patients over 26 Shingles Vaccine (Shingrix) 2 doses all adults 55 years and older Pneumonia (Pneumovax 23) All adults 65 years and older May recommend earlier dosing based on health history Pneumonia (Prevnar 62) All adults 65 years and older Dosed 1 year after Pneumovax 23   Additional Screening, Testing, and Vaccinations may be recommended on an individualized basis based on family history, health history, risk factors, and/or exposure.  __________________________________________________________   Diet Recommendations for All Patients   I recommend that all patients maintain a diet low in saturated fats, carbohydrates, and cholesterol. While this can be challenging at first, it is not impossible and small changes can make big differences.  Things to try: Decreasing the amount of soda, sweet tea, and/or juice to one or less per day and replace with water While water is always the first choice, if you do not like water you may consider adding a water additive without sugar to improve the taste other sugar free drinks Replace potatoes with a brightly colored vegetable at dinner Use healthy oils, such as canola oil or olive oil, instead of butter or hard margarine Limit your bread intake to two pieces or less a day Replace regular pasta with low carb pasta options Bake, broil, or grill foods instead of frying Monitor portion sizes  Eat smaller, more frequent meals throughout the day instead of large meals   An important thing to remember is, if you love foods that are not  great for your health, you don't have to give them up completely. Instead, allow these foods to be a reward when you have done well. Allowing yourself to still have special treats every  once in a while is a nice way to tell yourself thank you for working hard to keep yourself healthy.    Also remember that every day is a new day. If you have a bad day and fall off the wagon, you can still climb right back up and keep moving along on your journey!   We have resources available to help you!  Some websites that may be helpful include: www.Http://www.wall-moore.info/        Www.VeryWellFit.com _____________________________________________________________   Activity Recommendations for All Patients   I recommend that all adults get at least 20 minutes of moderate physical activity that elevates your heart rate at least 5 days out of the week.  Some examples include: Walking or jogging at a pace that allows you to carry on a conversation Cycling (stationary bike or outdoors) Water aerobics Yoga Weight lifting Dancing If physical limitations prevent you from putting stress on your joints, exercise in a pool or seated in a chair are excellent options.   Do determine your MAXIMUM heart rate for activity: YOUR AGE - 220 = MAX HeartRate    Remember! Do not push yourself too hard.  Start slowly and build up your pace, speed, weight, time in exercise, etc.  Allow your body to rest between exercise and get good sleep. You will need more water than normal when you are exerting yourself. Do not wait until you are thirsty to drink. Drink with a purpose of getting in at least 8, 8 ounce glasses of water a day plus more depending on how much you exercise and sweat.      If you begin to develop dizziness, chest pain, abdominal pain, jaw pain, shortness of breath, headache, vision changes, lightheadedness, or other concerning symptoms, stop the activity and allow your body to rest. If your symptoms are severe, seek emergency evaluation immediately. If your symptoms are concerning, but not severe, please let us  know so that we can recommend further evaluation.

## 2023-12-19 LAB — LIPID PANEL
Chol/HDL Ratio: 4.2 ratio (ref 0.0–5.0)
Cholesterol, Total: 180 mg/dL (ref 100–199)
HDL: 43 mg/dL (ref >39)
LDL Chol Calc (NIH): 107 mg/dL — ABNORMAL HIGH (ref 0–99)
Triglycerides: 172 mg/dL — ABNORMAL HIGH (ref 0–149)
VLDL Cholesterol Cal: 30 mg/dL (ref 5–40)

## 2023-12-19 LAB — CBC WITH DIFFERENTIAL/PLATELET
Basophils Absolute: 0 x10E3/uL (ref 0.0–0.2)
Basos: 1 %
EOS (ABSOLUTE): 0 x10E3/uL (ref 0.0–0.4)
Eos: 1 %
Hematocrit: 41.1 % (ref 37.5–51.0)
Hemoglobin: 13.9 g/dL (ref 13.0–17.7)
Immature Grans (Abs): 0 x10E3/uL (ref 0.0–0.1)
Immature Granulocytes: 0 %
Lymphocytes Absolute: 0.6 x10E3/uL — ABNORMAL LOW (ref 0.7–3.1)
Lymphs: 12 %
MCH: 31.4 pg (ref 26.6–33.0)
MCHC: 33.8 g/dL (ref 31.5–35.7)
MCV: 93 fL (ref 79–97)
Monocytes Absolute: 0.6 x10E3/uL (ref 0.1–0.9)
Monocytes: 12 %
Neutrophils Absolute: 3.5 x10E3/uL (ref 1.4–7.0)
Neutrophils: 74 %
Platelets: 191 x10E3/uL (ref 150–450)
RBC: 4.43 x10E6/uL (ref 4.14–5.80)
RDW: 12.4 % (ref 11.6–15.4)
WBC: 4.8 x10E3/uL (ref 3.4–10.8)

## 2023-12-19 LAB — COMPREHENSIVE METABOLIC PANEL WITH GFR
ALT: 28 IU/L (ref 0–44)
AST: 23 IU/L (ref 0–40)
Albumin: 4.7 g/dL (ref 4.1–5.1)
Alkaline Phosphatase: 78 IU/L (ref 47–123)
BUN/Creatinine Ratio: 11 (ref 9–20)
BUN: 14 mg/dL (ref 6–20)
Bilirubin Total: 0.5 mg/dL (ref 0.0–1.2)
CO2: 21 mmol/L (ref 20–29)
Calcium: 9.4 mg/dL (ref 8.7–10.2)
Chloride: 99 mmol/L (ref 96–106)
Creatinine, Ser: 1.22 mg/dL (ref 0.76–1.27)
Globulin, Total: 2.3 g/dL (ref 1.5–4.5)
Glucose: 83 mg/dL (ref 70–99)
Potassium: 4.1 mmol/L (ref 3.5–5.2)
Sodium: 139 mmol/L (ref 134–144)
Total Protein: 7 g/dL (ref 6.0–8.5)
eGFR: 80 mL/min/1.73 (ref >59)

## 2023-12-19 LAB — TSH RFX ON ABNORMAL TO FREE T4: TSH: 0.652 u[IU]/mL (ref 0.450–4.500)

## 2023-12-19 LAB — HEMOGLOBIN A1C
Est. average glucose Bld gHb Est-mCnc: 85 mg/dL
Hgb A1c MFr Bld: 4.6 % — ABNORMAL LOW (ref 4.8–5.6)

## 2023-12-24 ENCOUNTER — Ambulatory Visit: Payer: Self-pay | Admitting: Family Medicine

## 2023-12-25 ENCOUNTER — Telehealth: Payer: Self-pay | Admitting: Family Medicine

## 2023-12-25 DIAGNOSIS — F411 Generalized anxiety disorder: Secondary | ICD-10-CM | POA: Diagnosis not present

## 2023-12-25 NOTE — Telephone Encounter (Signed)
 Left a voice message for patient to come to the office to sign a form regarding health screening. Please call [phone]

## 2024-01-07 ENCOUNTER — Telehealth: Payer: Self-pay | Admitting: Family Medicine

## 2024-01-07 NOTE — Telephone Encounter (Signed)
 Patient walked in signed and picked up 2025 Annual Physical Form. Copy scanned to [facility] for chart

## 2024-01-15 DIAGNOSIS — F411 Generalized anxiety disorder: Secondary | ICD-10-CM | POA: Diagnosis not present

## 2024-06-09 ENCOUNTER — Ambulatory Visit (INDEPENDENT_AMBULATORY_CARE_PROVIDER_SITE_OTHER): Admitting: Otolaryngology

## 2024-06-16 ENCOUNTER — Ambulatory Visit: Admitting: Family Medicine
